# Patient Record
Sex: Male | Born: 1965 | Race: White | Hispanic: No | Marital: Single | Smoking: Former smoker
Health system: Southern US, Community
[De-identification: ages and names within clinical notes are randomized; demographics above are authoritative.]

## PROBLEM LIST (undated history)

## (undated) HISTORY — PX: HERNIA REPAIR: SHX51

---

## 2015-12-12 ENCOUNTER — Ambulatory Visit (HOSPITAL_COMMUNITY)
Admission: RE | Admit: 2015-12-12 | Discharge: 2015-12-12 | Disposition: A | Discharge status: Home / Self Care | Payer: BLUE CROSS/BLUE SHIELD | Source: Ambulatory Visit | Attending: Internal Medicine | Admitting: Internal Medicine

## 2015-12-12 ENCOUNTER — Other Ambulatory Visit (HOSPITAL_COMMUNITY): Payer: Self-pay | Admitting: Internal Medicine

## 2015-12-12 DIAGNOSIS — R059 Cough, unspecified: Secondary | ICD-10-CM

## 2015-12-12 DIAGNOSIS — R918 Other nonspecific abnormal finding of lung field: Secondary | ICD-10-CM | POA: Insufficient documentation

## 2015-12-12 DIAGNOSIS — R05 Cough: Secondary | ICD-10-CM | POA: Diagnosis not present

## 2015-12-26 ENCOUNTER — Other Ambulatory Visit (HOSPITAL_COMMUNITY): Payer: Self-pay | Admitting: Internal Medicine

## 2015-12-26 DIAGNOSIS — E559 Vitamin D deficiency, unspecified: Secondary | ICD-10-CM | POA: Diagnosis not present

## 2015-12-26 DIAGNOSIS — R9389 Abnormal findings on diagnostic imaging of other specified body structures: Secondary | ICD-10-CM

## 2015-12-26 DIAGNOSIS — E291 Testicular hypofunction: Secondary | ICD-10-CM | POA: Diagnosis not present

## 2015-12-26 DIAGNOSIS — R938 Abnormal findings on diagnostic imaging of other specified body structures: Secondary | ICD-10-CM | POA: Diagnosis not present

## 2016-01-03 ENCOUNTER — Ambulatory Visit (HOSPITAL_COMMUNITY)
Admission: RE | Admit: 2016-01-03 | Discharge: 2016-01-03 | Disposition: A | Discharge status: Home / Self Care | Payer: BLUE CROSS/BLUE SHIELD | Source: Ambulatory Visit | Attending: Internal Medicine | Admitting: Internal Medicine

## 2016-01-03 DIAGNOSIS — R0789 Other chest pain: Secondary | ICD-10-CM | POA: Insufficient documentation

## 2016-01-03 DIAGNOSIS — J984 Other disorders of lung: Secondary | ICD-10-CM | POA: Diagnosis not present

## 2016-01-03 DIAGNOSIS — R938 Abnormal findings on diagnostic imaging of other specified body structures: Secondary | ICD-10-CM | POA: Insufficient documentation

## 2016-01-03 DIAGNOSIS — R9389 Abnormal findings on diagnostic imaging of other specified body structures: Secondary | ICD-10-CM

## 2016-01-03 MED ORDER — IOPAMIDOL (ISOVUE-300) INJECTION 61%
75.0000 mL | Freq: Once | INTRAVENOUS | Status: AC | PRN
  Administered 2016-01-03: 75 mL via INTRAVENOUS

## 2016-02-12 DIAGNOSIS — E559 Vitamin D deficiency, unspecified: Secondary | ICD-10-CM | POA: Diagnosis not present

## 2016-02-12 DIAGNOSIS — Z7189 Other specified counseling: Secondary | ICD-10-CM | POA: Diagnosis not present

## 2016-02-12 DIAGNOSIS — E291 Testicular hypofunction: Secondary | ICD-10-CM | POA: Diagnosis not present

## 2016-02-12 DIAGNOSIS — R938 Abnormal findings on diagnostic imaging of other specified body structures: Secondary | ICD-10-CM | POA: Diagnosis not present

## 2016-07-07 DIAGNOSIS — E559 Vitamin D deficiency, unspecified: Secondary | ICD-10-CM | POA: Diagnosis not present

## 2016-11-04 DIAGNOSIS — H9311 Tinnitus, right ear: Secondary | ICD-10-CM | POA: Diagnosis not present

## 2016-11-04 DIAGNOSIS — M25569 Pain in unspecified knee: Secondary | ICD-10-CM | POA: Diagnosis not present

## 2017-01-19 DIAGNOSIS — R5383 Other fatigue: Secondary | ICD-10-CM | POA: Diagnosis not present

## 2017-01-19 DIAGNOSIS — Z0000 Encounter for general adult medical examination without abnormal findings: Secondary | ICD-10-CM | POA: Diagnosis not present

## 2017-01-19 DIAGNOSIS — Z125 Encounter for screening for malignant neoplasm of prostate: Secondary | ICD-10-CM | POA: Diagnosis not present

## 2017-01-19 DIAGNOSIS — E559 Vitamin D deficiency, unspecified: Secondary | ICD-10-CM | POA: Diagnosis not present

## 2017-01-19 DIAGNOSIS — F1010 Alcohol abuse, uncomplicated: Secondary | ICD-10-CM | POA: Diagnosis not present

## 2017-01-19 DIAGNOSIS — E291 Testicular hypofunction: Secondary | ICD-10-CM | POA: Diagnosis not present

## 2017-01-19 DIAGNOSIS — Z0389 Encounter for observation for other suspected diseases and conditions ruled out: Secondary | ICD-10-CM | POA: Diagnosis not present

## 2017-01-26 DIAGNOSIS — E291 Testicular hypofunction: Secondary | ICD-10-CM | POA: Diagnosis not present

## 2017-02-09 DIAGNOSIS — E291 Testicular hypofunction: Secondary | ICD-10-CM | POA: Diagnosis not present

## 2017-02-19 ENCOUNTER — Encounter: Payer: Self-pay | Admitting: Internal Medicine

## 2017-03-23 DIAGNOSIS — E291 Testicular hypofunction: Secondary | ICD-10-CM | POA: Diagnosis not present

## 2017-03-23 DIAGNOSIS — H9311 Tinnitus, right ear: Secondary | ICD-10-CM | POA: Diagnosis not present

## 2017-03-23 DIAGNOSIS — H9209 Otalgia, unspecified ear: Secondary | ICD-10-CM | POA: Diagnosis not present

## 2017-04-14 DIAGNOSIS — E291 Testicular hypofunction: Secondary | ICD-10-CM | POA: Diagnosis not present

## 2017-04-16 ENCOUNTER — Ambulatory Visit: Payer: BLUE CROSS/BLUE SHIELD | Admitting: Gastroenterology

## 2017-07-14 DIAGNOSIS — E291 Testicular hypofunction: Secondary | ICD-10-CM | POA: Diagnosis not present

## 2017-07-14 DIAGNOSIS — K439 Ventral hernia without obstruction or gangrene: Secondary | ICD-10-CM | POA: Diagnosis not present

## 2017-07-14 DIAGNOSIS — R6882 Decreased libido: Secondary | ICD-10-CM | POA: Diagnosis not present

## 2017-07-14 DIAGNOSIS — R5383 Other fatigue: Secondary | ICD-10-CM | POA: Diagnosis not present

## 2017-07-14 DIAGNOSIS — Z125 Encounter for screening for malignant neoplasm of prostate: Secondary | ICD-10-CM | POA: Diagnosis not present

## 2017-08-12 ENCOUNTER — Ambulatory Visit: Payer: BLUE CROSS/BLUE SHIELD | Admitting: General Surgery

## 2017-11-04 ENCOUNTER — Encounter (INDEPENDENT_AMBULATORY_CARE_PROVIDER_SITE_OTHER): Payer: Self-pay | Admitting: *Deleted

## 2017-12-06 ENCOUNTER — Encounter (INDEPENDENT_AMBULATORY_CARE_PROVIDER_SITE_OTHER): Payer: Self-pay | Admitting: *Deleted

## 2018-01-25 DIAGNOSIS — R6882 Decreased libido: Secondary | ICD-10-CM | POA: Diagnosis not present

## 2018-01-25 DIAGNOSIS — Z125 Encounter for screening for malignant neoplasm of prostate: Secondary | ICD-10-CM | POA: Diagnosis not present

## 2018-01-25 DIAGNOSIS — R5383 Other fatigue: Secondary | ICD-10-CM | POA: Diagnosis not present

## 2018-01-25 DIAGNOSIS — Z13220 Encounter for screening for lipoid disorders: Secondary | ICD-10-CM | POA: Diagnosis not present

## 2018-01-25 DIAGNOSIS — E559 Vitamin D deficiency, unspecified: Secondary | ICD-10-CM | POA: Diagnosis not present

## 2018-01-25 DIAGNOSIS — Z0000 Encounter for general adult medical examination without abnormal findings: Secondary | ICD-10-CM | POA: Diagnosis not present

## 2018-07-28 ENCOUNTER — Ambulatory Visit (INDEPENDENT_AMBULATORY_CARE_PROVIDER_SITE_OTHER): Payer: Self-pay | Admitting: Internal Medicine

## 2018-07-28 DIAGNOSIS — E559 Vitamin D deficiency, unspecified: Secondary | ICD-10-CM | POA: Diagnosis not present

## 2018-07-28 DIAGNOSIS — R030 Elevated blood-pressure reading, without diagnosis of hypertension: Secondary | ICD-10-CM | POA: Diagnosis not present

## 2018-07-28 DIAGNOSIS — R6882 Decreased libido: Secondary | ICD-10-CM | POA: Diagnosis not present

## 2018-07-28 DIAGNOSIS — R5383 Other fatigue: Secondary | ICD-10-CM | POA: Diagnosis not present

## 2018-09-18 IMAGING — DX DG CHEST 2V
2 series · 2 of 2 positions shown · non-contrast
Comparison: No recent prior.

CLINICAL DATA: Chronic cough.

EXAM:
CHEST  2 VIEW

[chest pa]
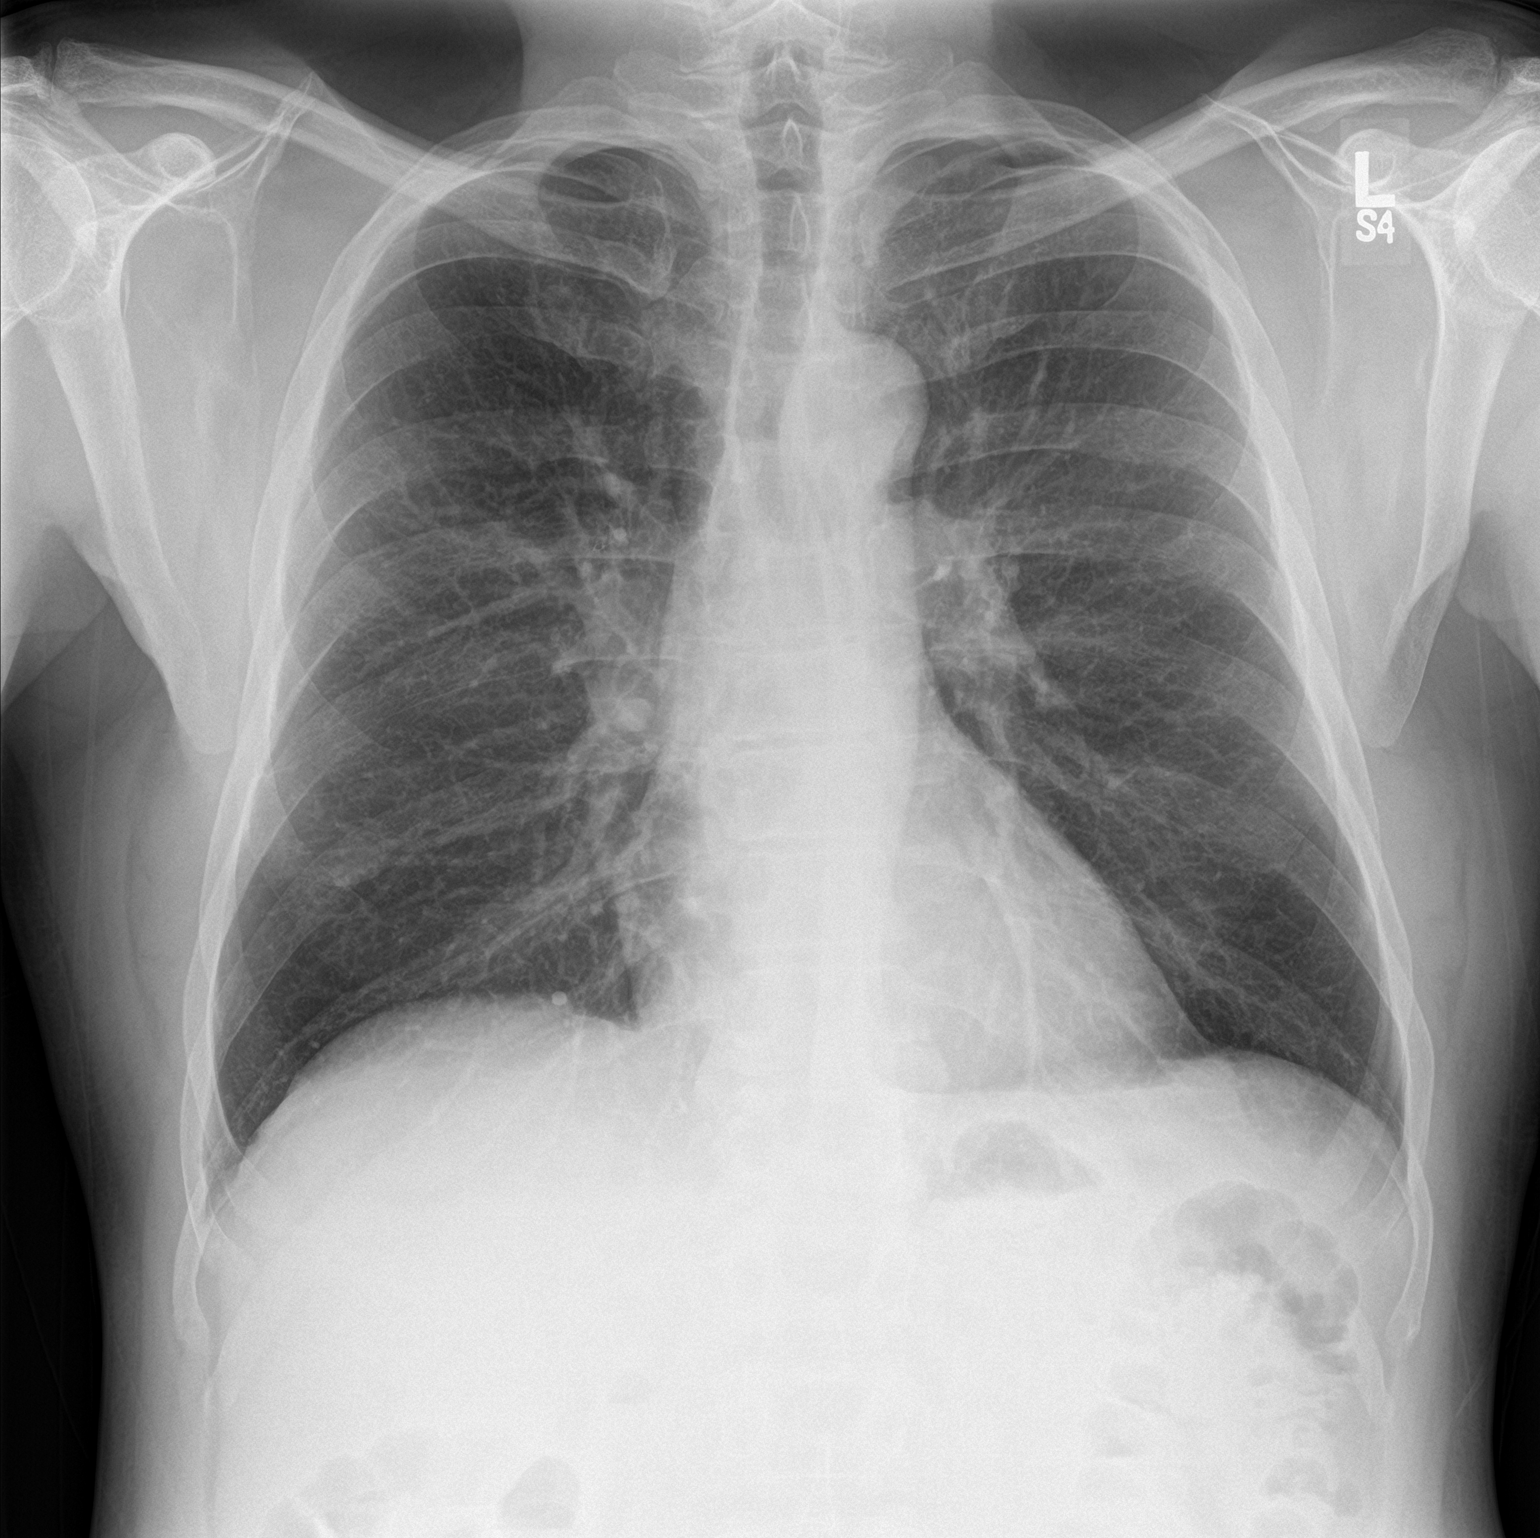

[chest lat]
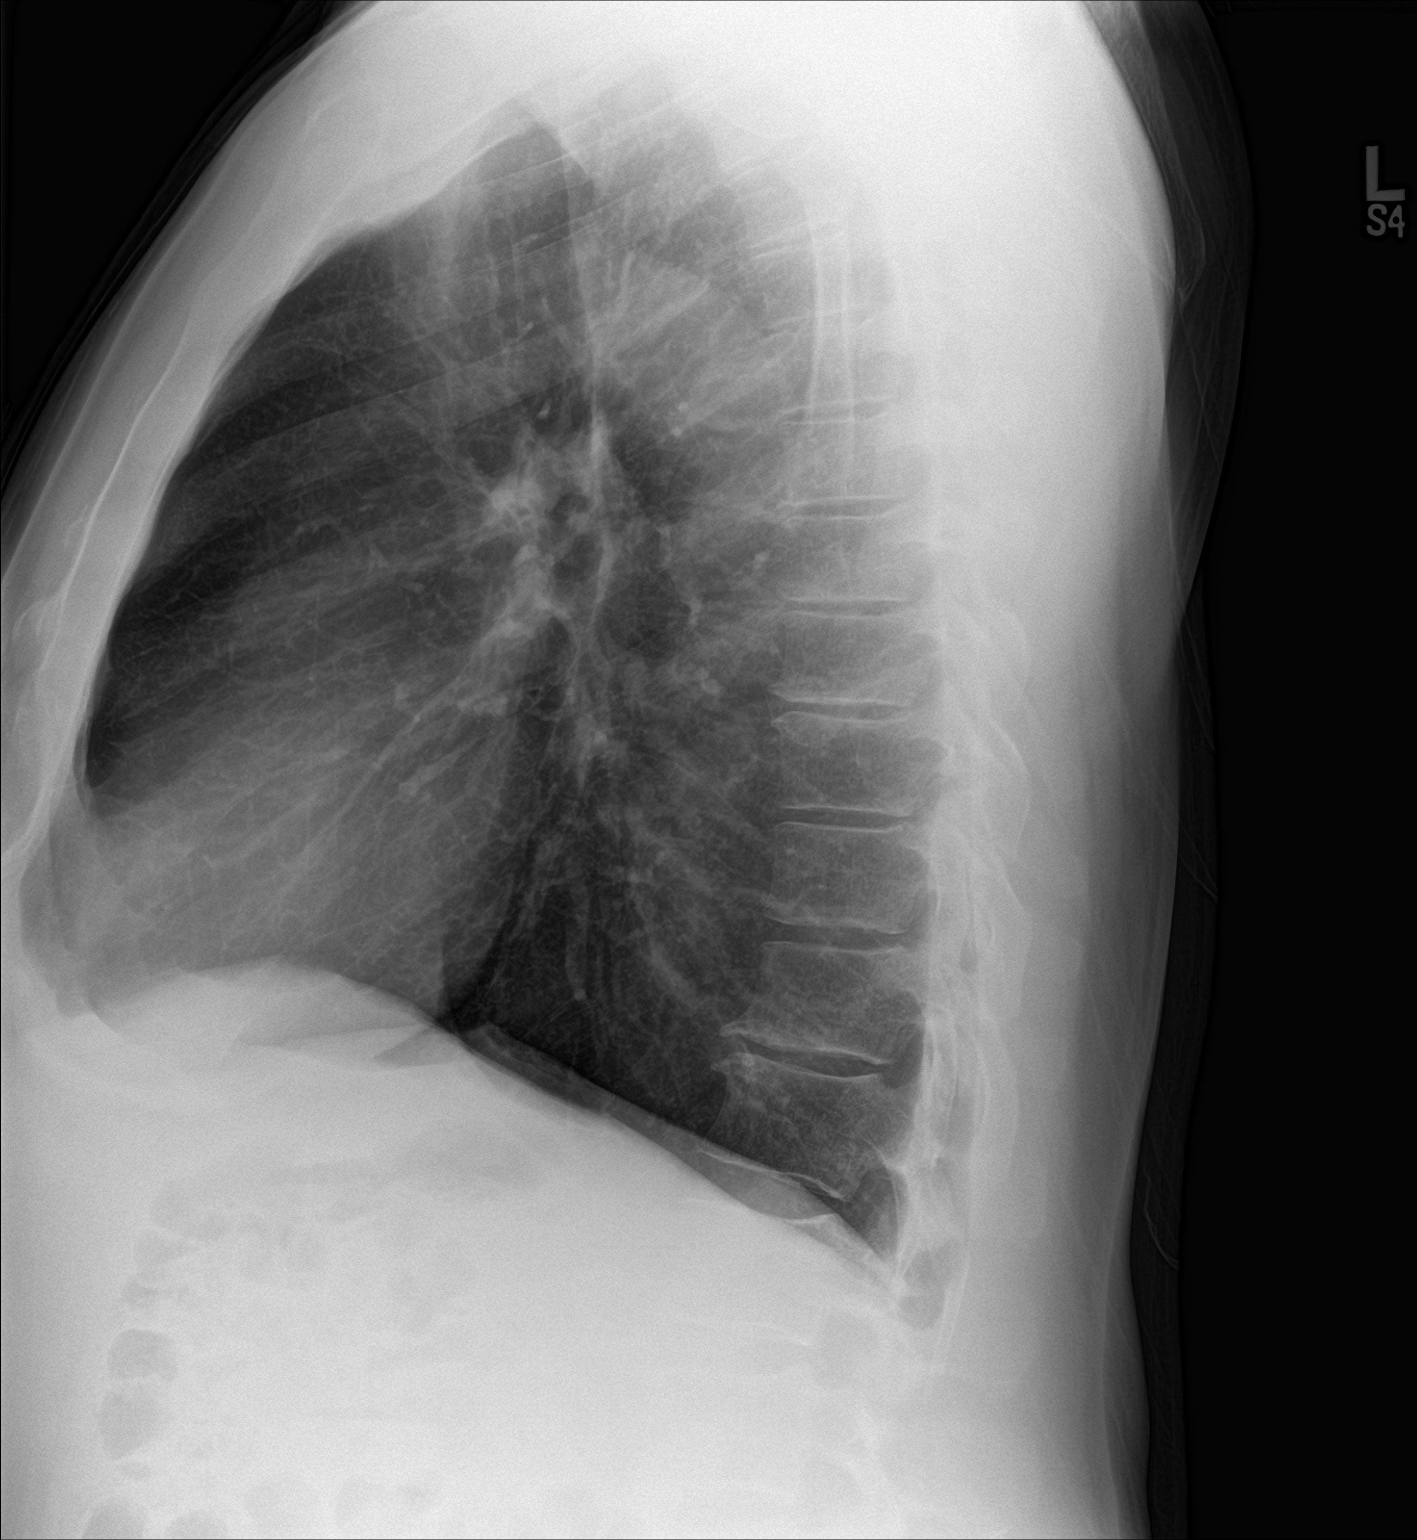

[2 of 2 positions shown; findings below may reference images not displayed]

FINDINGS: Mediastinum and hilar structures normal. Heart size normal. Mild
right middle lobe atelectasis and or infiltrate. No pleural effusion
or pneumothorax.
IMPRESSION: Mild right middle lobe atelectasis and or infiltrate .

## 2019-01-27 ENCOUNTER — Telehealth (INDEPENDENT_AMBULATORY_CARE_PROVIDER_SITE_OTHER): Payer: Self-pay

## 2019-01-31 ENCOUNTER — Encounter (INDEPENDENT_AMBULATORY_CARE_PROVIDER_SITE_OTHER): Payer: Self-pay | Admitting: Internal Medicine

## 2019-01-31 ENCOUNTER — Ambulatory Visit (INDEPENDENT_AMBULATORY_CARE_PROVIDER_SITE_OTHER): Payer: BC Managed Care – PPO | Admitting: Internal Medicine

## 2019-01-31 ENCOUNTER — Other Ambulatory Visit: Payer: Self-pay

## 2019-01-31 DIAGNOSIS — E559 Vitamin D deficiency, unspecified: Secondary | ICD-10-CM | POA: Diagnosis not present

## 2019-01-31 DIAGNOSIS — E291 Testicular hypofunction: Secondary | ICD-10-CM

## 2019-01-31 HISTORY — DX: Testicular hypofunction: E29.1

## 2019-01-31 HISTORY — DX: Vitamin D deficiency, unspecified: E55.9

## 2019-01-31 NOTE — Progress Notes (Signed)
Metrics: Intervention Frequency ACO  Documented Smoking Status Yearly  Screened one or more times in 24 months  Cessation Counseling or  Active cessation medication Past 24 months  Past 24 months   Guideline developer: UpToDate (See UpToDate for funding source) Date Released: 2014       Wellness Office Visit  Subjective:  Patient ID: Hunter Sosa, male    DOB: 05-Dec-1965  Age: 53 y.o. MRN: [id number]  CC: This man comes in for follow-up of hypogonadism and vitamin D deficiency. HPI He is doing well.  He continues on testosterone therapy once a week without any problems. He also did increase the vitamin D3 to 10,000 units daily from the last visit. 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 No pertinent family history.  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   Social History   Tobacco Use   Smoking status: Former Smoker   Smokeless tobacco: Never Used  Substance Use Topics   Alcohol use: Not on file    Current Meds  Medication Sig   Cholecalciferol (VITAMIN D-3) 125 MCG (5000 UT) TABS Take 2 tablets by mouth daily.   Testosterone Cypionate 200 MG/ML SOLN Inject 100 mg as directed once a week.      Bio Identical Hormones  Testosterone therapy is being used off label for symptoms of testosterone deficiency and benefits that it produces based on several studies.  These benefits include decreasing body fat, increasing in lean muscle mass and increasing in bone density.  There is improvement of memory, cognition.  There is improvement in exercise tolerance and endurance.  Testosterone therapy has also been shown to be protective against coronary artery disease, cerebrovascular disease, diabetes, hypertension and  degenerative joint disease. I have discussed with the patient the FDA warnings regarding testosterone therapy, benefits and side effects and modes of administration as well as monitoring blood levels and side effects  on a regular basis The patient is agreeable that testosterone therapy should be an integral part of his/her wellness,quality of life and prevention of chronic disease.  Objective:   Today's Vitals: BP (!) 150/90 (BP Location: Right Arm, Patient Position: Sitting, Cuff Size: Normal)    Pulse 71    Temp 98.8 F (37.1 C) (Temporal)    Resp 18    Ht 5' 11"  (1.803 m)    Wt 221 lb (100.2 kg)    SpO2 96% Comment: with mask.   BMI 30.82 kg/m  Vitals with BMI 01/31/2019  Height 5' 11"   Weight 221 lbs  BMI 03.01  Systolic 314  Diastolic 90  Pulse 71     Physical Exam  He looks systemically well.  Systolic and diastolic blood pressure somewhat elevated today and we will need to keep a close check on this.  He will do this at home.  He is alert and orientated without any focal neurological signs.    Assessment   1. Testicular failure   2. Vitamin D deficiency disease       Tests ordered Orders Placed This Encounter  Procedures   CMP with eGFR([facility])   Vitamin D, 25-hydroxy     Plan: 1. Blood work is ordered as above. 2. He is not keen to be on high blood pressure medicine if  he requires this so he will try to work on diet and we discussed intermittent fasting today as a way to also help his immune system. 3. Further recommendations will depend on blood results and I will see him in 6 months time for an annual physical exam.  I spent 25 to 30 minutes with patient face-to-face, more than 50% of the time was involved in discussing immunity and intermittent fasting.   No orders of the defined types were placed in this encounter.   Doree Albee, MD

## 2019-01-31 NOTE — Telephone Encounter (Signed)
Faxed to [place] [facility]

## 2019-01-31 NOTE — Patient Instructions (Signed)
[facility] Dietary Recommendations for Weight Loss What to Avoid . Avoid added sugars o Often added sugar can be found in processed foods such as many condiments, dry cereals, cakes, cookies, chips, crisps, crackers, candies, sweetened drinks, etc.  o Read labels and AVOID/DECREASE use of foods with the following in their ingredient list: Sugar, fructose, high fructose corn syrup, sucrose, glucose, maltose, dextrose, molasses, cane sugar, brown sugar, any type of syrup, agave nectar, etc.   . Avoid snacking in between meals . Avoid foods made with flour o If you are going to eat food made with flour, choose those made with whole-grains; and, minimize your consumption as much as is tolerable . Avoid processed foods o These foods are generally stocked in the middle of the grocery store. Focus on shopping on the perimeter of the grocery.  What to Include . Vegetables o GREEN LEAFY VEGETABLES: Kale, spinach, mustard greens, collard greens, cabbage, broccoli, etc. o OTHER: Asparagus, cauliflower, eggplant, carrots, peas, Brussel sprouts, tomatoes, bell peppers, zucchini, beets, cucumbers, etc. . Grains, seeds, and legumes o Beans: kidney beans, black eyed peas, garbanzo beans, black beans, pinto beans, etc. o Whole, unrefined grains: brown rice, barley, bulgur, oatmeal, etc. . Healthy fats  o Avoid highly processed fats such as vegetable oil o Examples of healthy fats: avocado, olives, virgin olive oil, dark chocolate (?72% Cocoa), nuts (peanuts, almonds, walnuts, cashews, pecans, etc.) . Low - Moderate Intake of Animal Sources of Protein o Meat sources: chicken, turkey, salmon, tuna. Limit to 4 ounces of meat at one time. o Consider limiting dairy sources, but when choosing dairy focus on: PLAIN Greek yogurt, cottage cheese, high-protein milk . Fruit o Choose berries  When to Eat . Intermittent Fasting: o Choosing not to eat for a specific time period, but DO FOCUS ON HYDRATION  when fasting o Multiple Techniques: - Time Restricted Eating: eat 3 meals in a day, each meal lasting no more than 60 minutes, no snacks between meals - 16-18 hour fast: fast for 16 to 18 hours up to 7 days a week. Often suggested to start with 2-3 nonconsecutive days per week.  . Remember the time you sleep is counted as fasting.  . Examples of eating schedule: Fast from 7:00pm-11:00am. Eat between 11:00am-7:00pm.  - 24-hour fast: fast for 24 hours up to every other day. Often suggested to start with 1 day per week . Remember the time you sleep is counted as fasting . Examples of eating schedule:  o Eating day: eat 2-3 meals on your eating day. If doing 2 meals, each meal should last no more than 90 minutes. If doing 3 meals, each meal should last no more than 60 minutes. Finish last meal by 7:00pm. o Fasting day: Fast until 7:00pm.  o IF YOU FEEL UNWELL FOR ANY REASON/IN ANY WAY WHEN FASTING, STOP FASTING BY EATING A NUTRITIOUS SNACK OR LIGHT MEAL o ALWAYS FOCUS ON HYDRATION DURING FASTS - Acceptable Hydration sources: water, broths, tea/coffee (black tea/coffee is best but using a small amount of whole-fat dairy products in coffee/tea is acceptable).  - Poor Hydration Sources: anything with sugar or artificial sweeteners added to it  These recommendations have been developed for patients that are actively receiving medical care from either Dr. Markeith Sosa or Hunter Gray, DNP, NP-C at [facility]. These recommendations are developed for patients with specific medical conditions and are not meant to be distributed or used by others that are not actively receiving care from either provider listed   above at [facility]. It is not appropriate to participate in the above eating plans without proper medical supervision.   Reference: Fung, J. The obesity code. [place]: [place]; 2016.   

## 2019-02-01 ENCOUNTER — Encounter (INDEPENDENT_AMBULATORY_CARE_PROVIDER_SITE_OTHER): Payer: Self-pay | Admitting: Internal Medicine

## 2019-02-01 LAB — VITAMIN D 25 HYDROXY (VIT D DEFICIENCY, FRACTURES): Vit D, 25-Hydroxy: 47 ng/mL (ref 30–100)

## 2019-02-01 LAB — COMPLETE METABOLIC PANEL WITH GFR
AG Ratio: 2 (calc) (ref 1.0–2.5)
ALT: 24 U/L (ref 9–46)
AST: 28 U/L (ref 10–35)
Albumin: 4.3 g/dL (ref 3.6–5.1)
Alkaline phosphatase (APISO): 50 U/L (ref 35–144)
BUN: 13 mg/dL (ref 7–25)
CO2: 26 mmol/L (ref 20–32)
Calcium: 9.5 mg/dL (ref 8.6–10.3)
Chloride: 102 mmol/L (ref 98–110)
Creat: 1.17 mg/dL (ref 0.70–1.33)
GFR, Est African American: 82 mL/min/{1.73_m2} (ref >=60)
GFR, Est Non African American: 71 mL/min/{1.73_m2} (ref >=60)
Globulin: 2.1 g/dL (calc) (ref 1.9–3.7)
Glucose, Bld: 94 mg/dL (ref 65–99)
Potassium: 4.2 mmol/L (ref 3.5–5.3)
Sodium: 138 mmol/L (ref 135–146)
Total Bilirubin: 0.5 mg/dL (ref 0.2–1.2)
Total Protein: 6.4 g/dL (ref 6.1–8.1)

## 2019-02-23 ENCOUNTER — Other Ambulatory Visit (INDEPENDENT_AMBULATORY_CARE_PROVIDER_SITE_OTHER): Payer: Self-pay

## 2019-02-24 MED ORDER — TESTOSTERONE CYPIONATE 200 MG/ML IJ SOLN: 100.0000 mg | INTRAMUSCULAR | 1 refills | Status: DC

## 2019-03-03 ENCOUNTER — Ambulatory Visit: Payer: BC Managed Care – PPO | Attending: Internal Medicine

## 2019-03-03 DIAGNOSIS — Z20822 Contact with and (suspected) exposure to covid-19: Secondary | ICD-10-CM

## 2019-03-03 DIAGNOSIS — Z20828 Contact with and (suspected) exposure to other viral communicable diseases: Secondary | ICD-10-CM | POA: Diagnosis not present

## 2019-03-04 LAB — NOVEL CORONAVIRUS, NAA: SARS-CoV-2, NAA: DETECTED — AB

## 2019-03-07 ENCOUNTER — Other Ambulatory Visit (INDEPENDENT_AMBULATORY_CARE_PROVIDER_SITE_OTHER): Payer: Self-pay | Admitting: Internal Medicine

## 2019-03-07 ENCOUNTER — Telehealth (INDEPENDENT_AMBULATORY_CARE_PROVIDER_SITE_OTHER): Payer: Self-pay

## 2019-03-07 DIAGNOSIS — U071 COVID-19: Secondary | ICD-10-CM

## 2019-03-07 NOTE — Telephone Encounter (Signed)
Faxed work note to [facility] to Ph:[phone] & [phone]

## 2019-03-07 NOTE — Telephone Encounter (Signed)
Pt stated

## 2019-07-31 ENCOUNTER — Other Ambulatory Visit: Payer: Self-pay

## 2019-07-31 ENCOUNTER — Ambulatory Visit (INDEPENDENT_AMBULATORY_CARE_PROVIDER_SITE_OTHER): Payer: BC Managed Care – PPO | Admitting: Internal Medicine

## 2019-07-31 ENCOUNTER — Encounter (INDEPENDENT_AMBULATORY_CARE_PROVIDER_SITE_OTHER): Payer: Self-pay | Admitting: Internal Medicine

## 2019-07-31 VITALS — BP 145/110 | HR 69 | Temp 97.9°F | Ht 71.0 in | Wt 221.4 lb

## 2019-07-31 DIAGNOSIS — E291 Testicular hypofunction: Secondary | ICD-10-CM | POA: Diagnosis not present

## 2019-07-31 DIAGNOSIS — Z0000 Encounter for general adult medical examination without abnormal findings: Secondary | ICD-10-CM

## 2019-07-31 DIAGNOSIS — E559 Vitamin D deficiency, unspecified: Secondary | ICD-10-CM

## 2019-07-31 DIAGNOSIS — Z125 Encounter for screening for malignant neoplasm of prostate: Secondary | ICD-10-CM | POA: Diagnosis not present

## 2019-07-31 LAB — PSA: PSA: 0.7 ng/mL (ref <=4.0)

## 2019-07-31 NOTE — Progress Notes (Signed)
Chief Complaint: This 54 year old man comes in for an annual physical exam. HPI: He has a history of hypogonadism and is on testosterone therapy once a week and is tolerating this.  He has no problems with this and seems to be beneficial for his symptoms. He also has vitamin D deficiency and is taking vitamin D3 supplementation on a regular basis.  His blood levels on the previous visit were reasonable. He now wishes to be referred for colonoscopy.  Past Medical History:  Diagnosis Date  . 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 Vitamin D deficiency disease 01/31/2019   History reviewed. No pertinent surgical history.   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    Social History   Tobacco Use  . Smoking status: Former Research scientist (life sciences)  . Smokeless tobacco: Never Used  Substance Use Topics  . Alcohol use: Not on file      Allergies: Not on File   Current Meds  Medication Sig  . Cholecalciferol (VITAMIN D-3) 125 MCG (5000 UT) TABS Take 2 tablets by mouth daily.  Marland Kitchen testosterone cypionate (DEPOTESTOSTERONE CYPIONATE) 200 MG/ML injection SMARTSIG:0.5 Milliliter(s) IM Once a Week  . Testosterone Cypionate 200 MG/ML SOLN Inject 100 mg as directed once a week.      Bio-identical hormones Testosterone therapy is being used off label for symptoms of testosterone deficiency and benefits that it produces based on several studies.  These benefits include decreasing body fat, increasing in lean muscle mass and increasing in bone density.  There is improvement of memory, cognition.  There is improvement in exercise tolerance and endurance.  Testosterone therapy has also been shown to be protective against coronary artery disease, cerebrovascular disease, diabetes, hypertension and degenerative joint disease. I have  discussed with the patient the FDA warnings regarding testosterone therapy, benefits and side effects and modes of administration as well as monitoring blood levels and side effects  on a regular basis The patient is agreeable that testosterone therapy should be an integral part of his/her wellness,quality of life and prevention of chronic disease.  [id number] from the symptoms mentioned above,there are no other symptoms referable to all systems reviewed.  Physical Exam: Blood pressure (!) 145/110, pulse 69, temperature 97.9 F (36.6 C), temperature source Temporal, height 5\' 11"  (1.803 m), weight 221 lb 6.4 oz (100.4 kg), SpO2 96 %. Vitals with BMI 07/31/2019 01/31/2019  Height 5\' 11"  5\' 11"   Weight 221 lbs 6 oz 221 lbs  BMI Q000111Q [id number]  Systolic Q000111Q Q000111Q  Diastolic A999333 90  Pulse 69 71      He looks systemically well.  He remains obese and he has not really lost any further weight since the last time I saw him nor has he gained any weight. General: Alert, cooperative, and appears to be the stated age.No pallor.  No jaundice.  No clubbing. Head: Normocephalic Eyes: Sclera white, pupils equal and reactive to light, red reflex x 2,  Ears: Normal bilaterally Oral cavity: Lips, mucosa, and tongue normal: Teeth and gums normal Neck: No adenopathy, supple, symmetrical, trachea midline, and thyroid does not appear enlarged Respiratory: Clear to auscultation bilaterally.No wheezing, crackles or bronchial breathing. Cardiovascular: Heart sounds are present and appear to be normal without murmurs or added sounds.  No carotid bruits.  Peripheral pulses are present and equal bilaterally.: Gastrointestinal:positive bowel sounds, no hepatosplenomegaly.  No masses felt.No tenderness. Skin: Clear, No rashes noted.No worrisome skin lesions seen. Neurological: Grossly intact without focal findings, cranial nerves II through XII intact, muscle strength equal bilaterally Musculoskeletal: No acute joint  abnormalities noted.Full range of movement noted with joints. Psychiatric: Affect appropriate, non-anxious.    Assessment  1. Testicular failure   2. Vitamin D deficiency disease   3. Routine general medical examination at a health care facility   4. Special screening for malignant neoplasm of prostate     Tests Ordered:   Orders Placed This Encounter  Procedures  . PSA     Plan  1. We will just check his PSA today as previous other blood work was within normal limits. 2. He will continue with testosterone therapy. 3. We will refer him to colonoscopy once he lets [place] know which gastroenterologist or surgeon he prefers in [place]. 4. Follow-up in 6 months.     No orders of the defined types were placed in this encounter.    Pollyann Roa C Eufelia Veno   07/31/2019, 4:36 PM

## 2019-08-08 ENCOUNTER — Telehealth (INDEPENDENT_AMBULATORY_CARE_PROVIDER_SITE_OTHER): Payer: Self-pay

## 2019-08-09 ENCOUNTER — Other Ambulatory Visit (INDEPENDENT_AMBULATORY_CARE_PROVIDER_SITE_OTHER): Payer: Self-pay

## 2019-08-09 ENCOUNTER — Telehealth (INDEPENDENT_AMBULATORY_CARE_PROVIDER_SITE_OTHER): Payer: Self-pay

## 2019-08-09 ENCOUNTER — Other Ambulatory Visit (INDEPENDENT_AMBULATORY_CARE_PROVIDER_SITE_OTHER): Payer: Self-pay | Admitting: Internal Medicine

## 2019-08-09 DIAGNOSIS — Z1211 Encounter for screening for malignant neoplasm of colon: Secondary | ICD-10-CM

## 2019-08-09 MED ORDER — ALBUTEROL SULFATE HFA 108 (90 BASE) MCG/ACT IN AERS: 2.0000 | INHALATION_SPRAY | Freq: Four times a day (QID) | RESPIRATORY_TRACT | 3 refills | Status: DC | PRN

## 2019-08-09 MED ORDER — TESTOSTERONE CYPIONATE 200 MG/ML IJ SOLN: 100.0000 mg | INTRAMUSCULAR | 1 refills | Status: DC

## 2019-08-09 NOTE — Telephone Encounter (Signed)
Please move his Rx for next refills to [facility] or  pharmacy he can get testosterone in 10Ml bottle without a circus and petting zoo show stated by mr Ellwood.

## 2019-08-09 NOTE — Telephone Encounter (Signed)
Let me know which pharmacy the patient would like to send me the testosterone prescription to.

## 2019-08-09 NOTE — Progress Notes (Signed)
Signed. Thanks.

## 2019-08-09 NOTE — Telephone Encounter (Signed)
I have sent testosterone refill and albuterol inhaler to [facility]. Okay to refer to the surgeon in [place].  I think his name is Dr. Ladona Horns.

## 2019-08-09 NOTE — Telephone Encounter (Signed)
Pt called and notified of refills. Putting in the referral for Dr Ladona Horns now for colonoscopy.

## 2019-08-09 NOTE — Telephone Encounter (Signed)
Pt is upset with [facility]. We may need to send RX's to different pharmacy. Call pt back to see what he would like to do

## 2019-08-17 ENCOUNTER — Other Ambulatory Visit (INDEPENDENT_AMBULATORY_CARE_PROVIDER_SITE_OTHER): Payer: Self-pay

## 2019-08-17 ENCOUNTER — Encounter (INDEPENDENT_AMBULATORY_CARE_PROVIDER_SITE_OTHER): Payer: Self-pay

## 2019-08-17 MED ORDER — TESTOSTERONE CYPIONATE 200 MG/ML IJ SOLN: 100.0000 mg | INTRAMUSCULAR | 1 refills | Status: DC

## 2019-08-29 ENCOUNTER — Other Ambulatory Visit (INDEPENDENT_AMBULATORY_CARE_PROVIDER_SITE_OTHER): Payer: Self-pay

## 2019-08-29 MED ORDER — TESTOSTERONE CYPIONATE 200 MG/ML IJ SOLN: 100.0000 mg | INTRAMUSCULAR | 1 refills | Status: DC

## 2019-08-29 NOTE — Telephone Encounter (Signed)
Rx went to [place] is asking to resend Rx to [facility], please advise?

## 2019-10-12 ENCOUNTER — Encounter (INDEPENDENT_AMBULATORY_CARE_PROVIDER_SITE_OTHER): Payer: Self-pay | Admitting: *Deleted

## 2019-12-25 ENCOUNTER — Other Ambulatory Visit (INDEPENDENT_AMBULATORY_CARE_PROVIDER_SITE_OTHER): Payer: Self-pay | Admitting: Internal Medicine

## 2019-12-25 ENCOUNTER — Telehealth (INDEPENDENT_AMBULATORY_CARE_PROVIDER_SITE_OTHER): Payer: Self-pay

## 2019-12-25 MED ORDER — TESTOSTERONE CYPIONATE 200 MG/ML IJ SOLN: 100.0000 mg | INTRAMUSCULAR | 1 refills | Status: DC

## 2019-12-25 MED ORDER — ALBUTEROL SULFATE HFA 108 (90 BASE) MCG/ACT IN AERS: 2.0000 | INHALATION_SPRAY | Freq: Four times a day (QID) | RESPIRATORY_TRACT | 3 refills | Status: DC | PRN

## 2019-12-25 NOTE — Telephone Encounter (Signed)
Called patient and LMOVM to return call  Left a voice message to let patient know that both meds have been sent to his pharmacy.

## 2019-12-25 NOTE — Telephone Encounter (Signed)
Please let the patient know that I have sent both these medications to [facility].

## 2019-12-25 NOTE — Telephone Encounter (Signed)
Patient called and left a VM that he needs refills on his:  Testosterone 200 mg Last filled 08/29/2019, 10 mL with 1 refill  Albuterol Inhaler Last filled 08/09/2019, 18 g with 3 refills  Last OV 07/31/2019 ROV 01/31/2020

## 2019-12-26 ENCOUNTER — Other Ambulatory Visit (INDEPENDENT_AMBULATORY_CARE_PROVIDER_SITE_OTHER): Payer: Self-pay | Admitting: Internal Medicine

## 2019-12-26 MED ORDER — TESTOSTERONE CYPIONATE 200 MG/ML IJ SOLN: 100.0000 mg | INTRAMUSCULAR | 1 refills | Status: DC

## 2019-12-26 MED ORDER — ALBUTEROL SULFATE HFA 108 (90 BASE) MCG/ACT IN AERS: 2.0000 | INHALATION_SPRAY | Freq: Four times a day (QID) | RESPIRATORY_TRACT | 3 refills | Status: DC | PRN

## 2020-01-01 ENCOUNTER — Other Ambulatory Visit (INDEPENDENT_AMBULATORY_CARE_PROVIDER_SITE_OTHER): Payer: Self-pay | Admitting: *Deleted

## 2020-01-01 ENCOUNTER — Encounter (INDEPENDENT_AMBULATORY_CARE_PROVIDER_SITE_OTHER): Payer: Self-pay | Admitting: *Deleted

## 2020-01-01 ENCOUNTER — Ambulatory Visit (INDEPENDENT_AMBULATORY_CARE_PROVIDER_SITE_OTHER): Payer: BC Managed Care – PPO | Admitting: Gastroenterology

## 2020-01-01 ENCOUNTER — Other Ambulatory Visit: Payer: Self-pay

## 2020-01-01 ENCOUNTER — Telehealth (INDEPENDENT_AMBULATORY_CARE_PROVIDER_SITE_OTHER): Payer: Self-pay | Admitting: *Deleted

## 2020-01-01 ENCOUNTER — Encounter (INDEPENDENT_AMBULATORY_CARE_PROVIDER_SITE_OTHER): Payer: Self-pay | Admitting: Gastroenterology

## 2020-01-01 VITALS — BP 168/101 | HR 56 | Temp 98.0°F | Ht 71.0 in | Wt 226.9 lb

## 2020-01-01 DIAGNOSIS — K219 Gastro-esophageal reflux disease without esophagitis: Secondary | ICD-10-CM

## 2020-01-01 DIAGNOSIS — Z800 Family history of malignant neoplasm of digestive organs: Secondary | ICD-10-CM

## 2020-01-01 DIAGNOSIS — Z7189 Other specified counseling: Secondary | ICD-10-CM | POA: Diagnosis not present

## 2020-01-01 MED ORDER — SUTAB 1479-225-188 MG PO TABS: 1.0000 | ORAL_TABLET | Freq: Once | ORAL | 0 refills | Status: AC

## 2020-01-01 MED ORDER — PANTOPRAZOLE SODIUM 40 MG PO TBEC: 40.0000 mg | DELAYED_RELEASE_TABLET | Freq: Every day | ORAL | 3 refills | Status: DC

## 2020-01-01 NOTE — Telephone Encounter (Signed)
Patient needs Sutab (copay card) ° °

## 2020-01-01 NOTE — Progress Notes (Signed)
Patient profile: Hunter Sosa is a 54 y.o. male seen for evaluation of GERD and colonoscopy.   History of Present Illness: Hunter Sosa is seen today for evaluation of GERD, he reports this is been ongoing since his teen years and he had an endoscopy as well as esophageal manometry about 30 years ago showing hiatal hernia.  He was #250lbs when symptoms were worst and improved as lost some weight. He is still having symptoms several times a week and uses Tums and Gas-X as needed. More recently over the past 2 months he is having issues with pain in his lower esophagus, this most notably occurs after eating lunch and feels that food is lodged for 30 to 45 seconds and cause discomfort.  Discomfort will resolve once food seem to pass to stomach.  He has some dysphagia if he eats too fast as well.  He denies any liquid or pill dysphagia.  Taking Tums 4-5 times a week and Gas-X as needed.  He denies nausea vomiting.  Generally having a bowel movement daily denies any lower abdominal pain, constipation, diarrhea, melena, rectal bleeding.  Non-smoker.  Denies NSAID use.  Does drink 6-9 beers per night, states he mainly drinks to help him sleep better.  History of asthma and uses albuterol inhaler on as needed basis with exertion, particularly walking up hills in farming. Slightly needing more since had COVID in Dec 2020  Wt Readings from Last 3 Encounters:  01/01/20 226 lb 14.4 oz (102.9 kg)  07/31/19 221 lb 6.4 oz (100.4 kg)  01/31/19 221 lb (100.2 kg)     Last Colonoscopy: Per patient 30 years ago Last Endoscopy: Per patient 30 years ago and also had manometry   Past Medical History:  Past Medical History:  Diagnosis Date  . 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 Vitamin D deficiency disease 01/31/2019    Problem List: Patient Active Problem List   Diagnosis Date Noted  . 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 Vitamin D deficiency disease 01/31/2019    Past Surgical History: Past Surgical  History:  Procedure Laterality Date  . HERNIA REPAIR Left    At age 52 , then again in 2004    Allergies: No Known Allergies    Home Medications:  Current Outpatient Medications:  .  albuterol (VENTOLIN HFA) 108 (90 Base) MCG/ACT inhaler, Inhale 2 puffs into the lungs every 6 (six) hours as needed for wheezing or shortness of breath., Disp: 18 g, Rfl: 3 .  Cholecalciferol (VITAMIN D-3) 125 MCG (5000 UT) TABS, Take 2 tablets by mouth daily., Disp: , Rfl:  .  Testosterone Cypionate 200 MG/ML SOLN, Inject 100 mg as directed once a week., Disp: 10 mL, Rfl: 1   Family History: family history includes Colon cancer in his father and mother; Crohn's disease in his father; Hypertension in his father.   Per patient father dx colon cancer in 53s and Crohn's in 40-50s   Social History:   reports that he quit smoking about 20 years ago. He quit smokeless tobacco use about 8 years ago. He reports current alcohol use of about 6.0 standard drinks of alcohol per week. He reports current drug use. Drug: Marijuana.   Review of Systems: Constitutional: Denies weight loss/weight gain  Eyes: No changes in vision. ENT: No oral lesions, sore throat.  GI: see HPI.  Heme/Lymph: No easy bruising.  CV: No chest pain.  GU: No hematuria.  Integumentary: No rashes.  Neuro: No headaches.  Psych: No depression/anxiety.  Endocrine: No heat/cold intolerance.  Allergic/Immunologic: No urticaria.  Resp: No cough, SOB.  Musculoskeletal: No joint swelling.    Physical Examination: BP (!) 185/91 (BP Location: Right Arm, Patient Position: Sitting, Cuff Size: Large)   Pulse (!) 56   Temp 98 F (36.7 C) (Oral)   Ht 5\' 11"  (1.803 m)   Wt 226 lb 14.4 oz (102.9 kg)   BMI 31.65 kg/m    Repeat - 168/101 - patient has BP cuff at home, will monitor and notify pcp if evelated  Gen: NAD, alert and oriented x 4 HEENT: PEERLA, EOMI, Neck: supple, no JVD Chest: CTA bilaterally, no wheezes, crackles, or other  adventitious sounds CV: RRR, no m/g/c/r Abd: soft, NT, ND, +BS in all four quadrants; no HSM, guarding, ridigity, or rebound tenderness Ext: no edema, well perfused with 2+ pulses, Skin: no rash or lesions noted on observed skin Lymph: no noted LAD  Data:   01/2019 labs reviewed    Assessment/Plan: Mr. Rathgeber is a 54 y.o. male  Rosalio was seen today for new patient (initial visit).  Diagnoses and all orders for this visit:  Family history of colon cancer  Gastroesophageal reflux disease, unspecified whether esophagitis present      1.  GERD-with occasional dysphagia, needs upper endoscopy for evaluation.  He is not currently on a PPI and will start Protonix 40 mg once a day.  We discussed diet modifications. Does eat late in evenings - will try to remain upright after meals. Alcohol reduction as below.  2.  Family history of colon cancer-mother and father both in their 20s.  Due for colonoscopy (none prior). No lower GI sx.   3. Alcohol abuse - discussed decreasing daily alcohol intake and long term consequences of heavy frequent alcohol use. Last LFTs in 01/2019 at PCP were normal. Has yearly f/up next month. This may be problematic long term if not decreased   Patient denies CP, SOB, and use of blood thinners. I discussed the risks and benefits of procedure including bleeding, perforation, infection, missed lesions, medication reactions and possible hospitalization or surgery if complications. All questions answered.  Denies prior issues with sedation.  I personally performed the service, non-incident to. (WP)  Laurine Blazer, [facility]

## 2020-01-01 NOTE — Patient Instructions (Addendum)
We are scheduling endoscopy and colonoscopy. Please monitor blood pressure and decrease alcohol intake as discussed

## 2020-01-03 DIAGNOSIS — K219 Gastro-esophageal reflux disease without esophagitis: Secondary | ICD-10-CM | POA: Insufficient documentation

## 2020-01-08 ENCOUNTER — Ambulatory Visit (INDEPENDENT_AMBULATORY_CARE_PROVIDER_SITE_OTHER): Payer: BC Managed Care – PPO | Admitting: Gastroenterology

## 2020-01-11 ENCOUNTER — Telehealth (INDEPENDENT_AMBULATORY_CARE_PROVIDER_SITE_OTHER): Payer: Self-pay

## 2020-01-11 NOTE — Telephone Encounter (Signed)
Please notify patient I would definitely recommend continuing with upper endoscopy if he is having trouble swallowing as she may have a narrowing/stricture.  If his dysphagia has completely resolved he can hold off and we can just do a colonoscopy.

## 2020-01-11 NOTE — Telephone Encounter (Signed)
Hunter Sosa is calling stating that the Pantoprazole 40mg  is helping and he is wanting to hold off on the Upper Endoscopy at this time, Please advise?

## 2020-01-17 ENCOUNTER — Other Ambulatory Visit (HOSPITAL_COMMUNITY)
Admission: RE | Admit: 2020-01-17 | Discharge: 2020-01-17 | Disposition: A | Discharge status: Home / Self Care | Payer: BC Managed Care – PPO | Source: Ambulatory Visit | Attending: Internal Medicine | Admitting: Internal Medicine

## 2020-01-17 ENCOUNTER — Other Ambulatory Visit: Payer: Self-pay

## 2020-01-17 DIAGNOSIS — Z01812 Encounter for preprocedural laboratory examination: Secondary | ICD-10-CM | POA: Insufficient documentation

## 2020-01-17 DIAGNOSIS — Z20822 Contact with and (suspected) exposure to covid-19: Secondary | ICD-10-CM | POA: Insufficient documentation

## 2020-01-17 LAB — SARS CORONAVIRUS 2 (TAT 6-24 HRS): SARS Coronavirus 2: NEGATIVE

## 2020-01-18 ENCOUNTER — Other Ambulatory Visit: Payer: Self-pay

## 2020-01-18 ENCOUNTER — Ambulatory Visit (HOSPITAL_COMMUNITY)
Admission: RE | Admit: 2020-01-18 | Discharge: 2020-01-18 | Disposition: A | Discharge status: Home / Self Care | Payer: BC Managed Care – PPO | Attending: Internal Medicine | Admitting: Internal Medicine

## 2020-01-18 ENCOUNTER — Encounter (HOSPITAL_COMMUNITY): Payer: Self-pay | Admitting: Internal Medicine

## 2020-01-18 ENCOUNTER — Encounter (HOSPITAL_COMMUNITY)
Admission: RE | Disposition: A | Discharge status: Home / Self Care | Payer: Self-pay | Source: Home / Self Care | Attending: Internal Medicine

## 2020-01-18 DIAGNOSIS — D125 Benign neoplasm of sigmoid colon: Secondary | ICD-10-CM | POA: Diagnosis not present

## 2020-01-18 DIAGNOSIS — Z1211 Encounter for screening for malignant neoplasm of colon: Secondary | ICD-10-CM | POA: Diagnosis not present

## 2020-01-18 DIAGNOSIS — K635 Polyp of colon: Secondary | ICD-10-CM | POA: Diagnosis not present

## 2020-01-18 DIAGNOSIS — D127 Benign neoplasm of rectosigmoid junction: Secondary | ICD-10-CM | POA: Diagnosis not present

## 2020-01-18 DIAGNOSIS — K219 Gastro-esophageal reflux disease without esophagitis: Secondary | ICD-10-CM

## 2020-01-18 DIAGNOSIS — Z800 Family history of malignant neoplasm of digestive organs: Secondary | ICD-10-CM | POA: Insufficient documentation

## 2020-01-18 HISTORY — PX: COLONOSCOPY: SHX5424

## 2020-01-18 HISTORY — PX: POLYPECTOMY: SHX5525

## 2020-01-18 LAB — HM COLONOSCOPY

## 2020-01-18 SURGERY — COLONOSCOPY
Anesthesia: Moderate Sedation

## 2020-01-18 MED ORDER — LIDOCAINE VISCOUS HCL 2 % MT SOLN
OROMUCOSAL | Status: AC
  Filled 2020-01-18: qty 15

## 2020-01-18 MED ORDER — STERILE WATER FOR IRRIGATION IR SOLN
Status: DC | PRN
  Administered 2020-01-18: 100 mL

## 2020-01-18 MED ORDER — MEPERIDINE HCL 50 MG/ML IJ SOLN
INTRAMUSCULAR | Status: AC
  Filled 2020-01-18: qty 1

## 2020-01-18 MED ORDER — MIDAZOLAM HCL 5 MG/5ML IJ SOLN
INTRAMUSCULAR | Status: DC | PRN
  Administered 2020-01-18 (×2): 2 mg via INTRAVENOUS
  Administered 2020-01-18: 3 mg via INTRAVENOUS
  Administered 2020-01-18: 2 mg via INTRAVENOUS

## 2020-01-18 MED ORDER — MEPERIDINE HCL 50 MG/ML IJ SOLN
INTRAMUSCULAR | Status: DC | PRN
  Administered 2020-01-18 (×3): 25 mg

## 2020-01-18 MED ORDER — SODIUM CHLORIDE 0.9 % IV SOLN: INTRAVENOUS | Status: DC

## 2020-01-18 MED ORDER — MIDAZOLAM HCL 5 MG/5ML IJ SOLN
INTRAMUSCULAR | Status: AC
  Filled 2020-01-18: qty 10

## 2020-01-18 NOTE — Discharge Instructions (Signed)
No aspirin or NSAIDs for 2 days. Resume usual medications and diet as before. No driving for 24 hours. Physician will call you with biopsy results.   Colon Polyps  Polyps are tissue growths inside the body. Polyps can grow in many places, including the large intestine (colon). A polyp may be a round bump or a mushroom-shaped growth. You could have one polyp or several. Most colon polyps are noncancerous (benign). However, some colon polyps can become cancerous over time. Finding and removing the polyps early can help prevent this. What are the causes? The exact cause of colon polyps is not known. What increases the risk? You are more likely to develop this condition if you:  Have a family history of colon cancer or colon polyps.  Are older than 24 or older than 45 if you are African American.  Have inflammatory bowel disease, such as ulcerative colitis or Crohn's disease.  Have certain hereditary conditions, such as: ? Familial adenomatous polyposis. ? Lynch syndrome. ? Turcot syndrome. ? Peutz-Jeghers syndrome.  Are overweight.  Smoke cigarettes.  Do not get enough exercise.  Drink too much alcohol.  Eat a diet that is high in fat and red meat and low in fiber.  Had childhood cancer that was treated with abdominal radiation. What are the signs or symptoms? Most polyps do not cause symptoms. If you have symptoms, they may include:  Blood coming from your rectum when having a bowel movement.  Blood in your stool. The stool may look dark red or black.  Abdominal pain.  A change in bowel habits, such as constipation or diarrhea. How is this diagnosed? This condition is diagnosed with a colonoscopy. This is a procedure in which a lighted, flexible scope is inserted into the anus and then passed into the colon to examine the area. Polyps are sometimes found when a colonoscopy is done as part of routine cancer screening tests. How is this treated? Treatment for this  condition involves removing any polyps that are found. Most polyps can be removed during a colonoscopy. Those polyps will then be tested for cancer. Additional treatment may be needed depending on the results of testing. Follow these instructions at home: Lifestyle  Maintain a healthy weight, or lose weight if recommended by your health care provider.  Exercise every day or as told by your health care provider.  Do not use any products that contain nicotine or tobacco, such as cigarettes and e-cigarettes. If you need help quitting, ask your health care provider.  If you drink alcohol, limit how much you have: ? 0-1 drink a day for women. ? 0-2 drinks a day for men.  Be aware of how much alcohol is in your drink. In the [place] one drink equals one 12 oz bottle of beer (355 mL), one 5 oz glass of wine (148 mL), or one 1 oz shot of hard liquor (44 mL). Eating and drinking   Eat foods that are high in fiber, such as fruits, vegetables, and whole grains.  Eat foods that are high in calcium and vitamin D, such as milk, cheese, yogurt, eggs, liver, fish, and broccoli.  Limit foods that are high in fat, such as fried foods and desserts.  Limit the amount of red meat and processed meat you eat, such as hot dogs, sausage, bacon, and lunch meats. General instructions  Keep all follow-up visits as told by your health care provider. This is important. ? This includes having regularly scheduled colonoscopies. ? Talk to your  health care provider about when you need a colonoscopy. Contact a health care provider if:  You have new or worsening bleeding during a bowel movement.  You have new or increased blood in your stool.  You have a change in bowel habits.  You lose weight for no known reason. Summary  Polyps are tissue growths inside the body. Polyps can grow in many places, including the colon.  Most colon polyps are noncancerous (benign), but some can become cancerous over  time.  This condition is diagnosed with a colonoscopy.  Treatment for this condition involves removing any polyps that are found. Most polyps can be removed during a colonoscopy. This information is not intended to replace advice given to you by your health care provider. Make sure you discuss any questions you have with your health care provider. Document Revised: 06/17/2017 Document Reviewed: 06/17/2017 Elsevier Patient Education  [place].    Colonoscopy, Adult, Care After This sheet gives you information about how to care for yourself after your procedure. Your doctor may also give you more specific instructions. If you have problems or questions, call your doctor. What can I expect after the procedure? After the procedure, it is common to have:  A small amount of blood in your poop (stool) for 24 hours.  Some gas.  Mild cramping or bloating in your belly (abdomen). Follow these instructions at home: Eating and drinking   Drink enough fluid to keep your pee (urine) pale yellow.  Follow instructions from your doctor about what you cannot eat or drink.  Return to your normal diet as told by your doctor. Avoid heavy or fried foods that are hard to digest. Activity  Rest as told by your doctor.  Do not sit for a long time without moving. Get up to take short walks every 1-2 hours. This is important. Ask for help if you feel weak or unsteady.  Return to your normal activities as told by your doctor. Ask your doctor what activities are safe for you. To help cramping and bloating:   Try walking around.  Put heat on your belly as told by your doctor. Use the heat source that your doctor recommends, such as a moist heat pack or a heating pad. ? Put a towel between your skin and the heat source. ? Leave the heat on for 20-30 minutes. ? Remove the heat if your skin turns bright red. This is very important if you are unable to feel pain, heat, or cold. You may have a  greater risk of getting burned. General instructions  For the first 24 hours after the procedure: ? Do not drive or use machinery. ? Do not sign important documents. ? Do not drink alcohol. ? Do your daily activities more slowly than normal. ? Eat foods that are soft and easy to digest.  Take over-the-counter or prescription medicines only as told by your doctor.  Keep all follow-up visits as told by your doctor. This is important. Contact a doctor if:  You have blood in your poop 2-3 days after the procedure. Get help right away if:  You have more than a small amount of blood in your poop.  You see large clumps of tissue (blood clots) in your poop.  Your belly is swollen.  You feel like you may vomit (nauseous).  You vomit.  You have a fever.  You have belly pain that gets worse, and medicine does not help your pain. Summary  After the procedure, it  is common to have a small amount of blood in your poop. You may also have mild cramping and bloating in your belly.  For the first 24 hours after the procedure, do not drive or use machinery, do not sign important documents, and do not drink alcohol.  Get help right away if you have a lot of blood in your poop, feel like you may vomit, have a fever, or have more belly pain. This information is not intended to replace advice given to you by your health care provider. Make sure you discuss any questions you have with your health care provider. Document Revised: 09/26/2018 Document Reviewed: 09/26/2018 Elsevier Patient Education  [place].

## 2020-01-18 NOTE — Telephone Encounter (Signed)
Patient had EGD done today

## 2020-01-18 NOTE — Op Note (Signed)
[facility] Patient Name: Hunter Sosa Procedure Date: 01/18/2020 2:23 PM MRN: [id number] Date of Birth: Apr 15, 1965 Attending MD: Hildred Laser , MD CSN: [id number] Age: 54 Admit Type: Outpatient Procedure:                Colonoscopy Indications:              Screening in patient at increased risk: Colorectal                            cancer in mother before age 39, Screening in                            patient at increased risk: Colorectal cancer in                            father before age 59 Providers:                Hildred Laser, MD, Janeece Riggers, RN, Lambert Mody, Kristine L. Risa Grill, Technician,                            Aram Candela Referring MD:             Doree Albee, MD Medicines:                Meperidine 75 mg IV, Midazolam 9 mg IV Complications:            No immediate complications. Estimated Blood Loss:     Estimated blood loss was minimal. Procedure:                Pre-Anesthesia Assessment:                           - Prior to the procedure, a History and Physical                            was performed, and patient medications and                            allergies were reviewed. The patient's tolerance of                            previous anesthesia was also reviewed. The risks                            and benefits of the procedure and the sedation                            options and risks were discussed with the patient.                            All questions were answered, and informed consent  was obtained. Prior Anticoagulants: The patient has                            taken no previous anticoagulant or antiplatelet                            agents except for aspirin. ASA Grade Assessment: II                            - A patient with mild systemic disease. After                            reviewing the risks and benefits, the patient was                             deemed in satisfactory condition to undergo the                            procedure.                           After obtaining informed consent, the colonoscope                            was passed under direct vision. Throughout the                            procedure, the patient's blood pressure, pulse, and                            oxygen saturations were monitored continuously. The                            [id number]-[id number] ([id number]) was introduced through the                            anus and advanced to the the cecum, identified by                            appendiceal orifice and ileocecal valve. The                            colonoscopy was performed without difficulty. The                            patient tolerated the procedure well. The quality                            of the bowel preparation was good except the                            ascending colon was fair. The ileocecal valve,  appendiceal orifice, and rectum were photographed. Scope In: 2:37:07 PM Scope Out: 3:02:06 PM Scope Withdrawal Time: 0 hours 20 minutes 23 seconds  Total Procedure Duration: 0 hours 24 minutes 59 seconds  Findings:      The perianal and digital rectal examinations were normal.      A 6 mm polyp was found in the mid sigmoid colon. The polyp was       semi-pedunculated. The polyp was removed with a cold snare. Resection       and retrieval were complete. The pathology specimen was placed into       Bottle Number 1.      Two polyps were found in the recto-sigmoid colon. The polyps were 4 to 7       mm in size. These polyps were removed with a cold snare. Resection and       retrieval were complete. The pathology specimen was placed into Bottle       Number 2.      The retroflexed view of the distal rectum and anal verge was normal and       showed no anal or rectal abnormalities. Impression:               - One 6 mm polyp in the mid sigmoid colon, removed                             with a cold snare. Resected and retrieved.                           - Two 4 to 7 mm polyps at the recto-sigmoid colon,                            removed with a cold snare. Resected and retrieved. Moderate Sedation:      Moderate (conscious) sedation was administered by the endoscopy nurse       and supervised by the endoscopist. The following parameters were       monitored: oxygen saturation, heart rate, blood pressure, CO2       capnography and response to care. Total physician intraservice time was       33 minutes. Recommendation:           - Patient has a contact number available for                            emergencies. The signs and symptoms of potential                            delayed complications were discussed with the                            patient. Return to normal activities tomorrow.                            Written discharge instructions were provided to the                            patient.                           -  Resume previous diet today.                           - Continue present medications.                           - No aspirin, ibuprofen, naproxen, or other                            non-steroidal anti-inflammatory drugs for 2 days.                           - Await pathology results.                           - Repeat colonoscopy for surveillance based on                            pathology results. Procedure Code(s):        --- Professional ---                           [phone], Colonoscopy, flexible; with removal of                            tumor(s), polyp(s), or other lesion(s) by snare                            technique                           99153, Moderate sedation; each additional 15                            minutes intraservice time                           G0500, Moderate sedation services provided by the                            same physician or other qualified health care                             professional performing a gastrointestinal                            endoscopic service that sedation supports,                            requiring the presence of an independent trained                            observer to assist in the monitoring of the                            patient's level of consciousness and physiological  status; initial 15 minutes of intra-service time;                            patient age 59 years or older (additional time may                            be reported with [id number], as appropriate) Diagnosis Code(s):        --- Professional ---                           K63.5, Polyp of colon                           Z80.0, Family history of malignant neoplasm of                            digestive organs CPT copyright 2019 American Medical Association. All rights reserved. The codes documented in this report are preliminary and upon coder review may  be revised to meet current compliance requirements. Hildred Laser, MD Hildred Laser, MD 01/18/2020 3:11:03 PM This report has been signed electronically. Number of Addenda: 0

## 2020-01-18 NOTE — H&P (Signed)
Hunter Sosa is an 54 y.o. male.   Chief Complaint: Patient is here for esophagogastroduodenoscopy and colonoscopy. HPI: Patient is 54 year old Caucasian male who has chronic GERD seen in the office last month for symptoms of GERD and dysphagia.  He was begun on PPI.  He says his symptoms have completely resolved.  He is not having any dysphagia and therefore not interested in undergoing esophagogastroduodenoscopy.  However he is interested to proceed with screening colonoscopy.  He denies abdominal pain change in bowel habits or rectal bleeding. Both parents had colon carcinoma in their 17s.  Mother is still living.  Father died of lung cancer at 24.  Mother is 13 years old. Patient does not take aspirin or NSAIDs.  Past Medical History:  Diagnosis Date  . 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 Vitamin D deficiency disease 01/31/2019    Past Surgical History:  Procedure Laterality Date  . HERNIA REPAIR Left    At age 17 , then again in 2004    Family History  Problem Relation Age of Onset  . Colon cancer Mother   . Colon cancer Father   . Crohn's disease Father   . Hypertension Father    Social History:  reports that he quit smoking about 20 years ago. He quit smokeless tobacco use about 8 years ago. He reports current alcohol use of about 6.0 standard drinks of alcohol per week. He reports current drug use. Drug: Marijuana.  Allergies: No Known Allergies  Medications Prior to Admission  Medication Sig Dispense Refill  . albuterol (VENTOLIN HFA) 108 (90 Base) MCG/ACT inhaler Inhale 2 puffs into the lungs every 6 (six) hours as needed for wheezing or shortness of breath. 18 g 3  . Cholecalciferol (VITAMIN D-3) 125 MCG (5000 UT) TABS Take 10,000 Units by mouth daily.     . pantoprazole (PROTONIX) 40 MG tablet Take 1 tablet (40 mg total) by mouth daily. 30 tablet 3  . Testosterone Cypionate 200 MG/ML SOLN Inject 100 mg as directed once a week. (Patient taking differently: Inject 100 mg  as directed once a week. Monday) 10 mL 1    Results for orders placed or performed during the hospital encounter of 01/17/20 (from the past 48 hour(s))  SARS CORONAVIRUS 2 (TAT 6-24 HRS) Nasopharyngeal Nasopharyngeal Swab     Status: None   Collection Time: 01/17/20 12:50 PM   Specimen: Nasopharyngeal Swab  Result Value Ref Range   SARS Coronavirus 2 NEGATIVE NEGATIVE    Comment: (NOTE) SARS-CoV-2 target nucleic acids are NOT DETECTED.  The SARS-CoV-2 RNA is generally detectable in upper and lower respiratory specimens during the acute phase of infection. Negative results do not preclude SARS-CoV-2 infection, do not rule out co-infections with other pathogens, and should not be used as the sole basis for treatment or other patient management decisions. Negative results must be combined with clinical observations, patient history, and epidemiological information. The expected result is Negative.  Fact Sheet for Patients: SugarRoll.be  Fact Sheet for Healthcare Providers: https://www.woods-mathews.com/  This test is not yet approved or cleared by the [place] FDA and  has been authorized for detection and/or diagnosis of SARS-CoV-2 by FDA under an Emergency Use Authorization (EUA). This EUA will remain  in effect (meaning this test can be used) for the duration of the COVID-19 declaration under Se ction 564(b)(1) of the Act, 21 U.S.C. section 360bbb-3(b)(1), unless the authorization is terminated or revoked sooner.  Performed at [facility], [place],  [place] [address]    No results found.  Review of Systems  Blood pressure (!) 165/100, temperature 99 F (37.2 C), temperature source Oral, resp. rate 16, height 5\' 10"  (1.778 m), weight 99.8 kg, SpO2 97 %. Physical Exam HENT:     Mouth/Throat:     Mouth: Mucous membranes are moist.     Pharynx: Oropharynx is clear.  Eyes:     General: No scleral  icterus.    Conjunctiva/sclera: Conjunctivae normal.  Cardiovascular:     Rate and Rhythm: Normal rate and regular rhythm.     Heart sounds: Normal heart sounds. No murmur heard.   Pulmonary:     Effort: Pulmonary effort is normal.     Breath sounds: Normal breath sounds.  Abdominal:     General: There is no distension.     Palpations: Abdomen is soft. There is no mass.     Tenderness: There is no abdominal tenderness.  Musculoskeletal:        General: No swelling.     Cervical back: Neck supple. No rigidity.  Skin:    General: Skin is warm and dry.  Neurological:     Mental Status: He is alert.      Assessment/Plan  GERD and history of dysphagia. Patient symptoms have resolved with PPI and he is not interested in EGD anymore. Screening colonoscopy.  Both parents had colon carcinoma in their 10s.  Hildred Laser, MD 01/18/2020, 2:26 PM

## 2020-01-22 LAB — SURGICAL PATHOLOGY

## 2020-01-24 ENCOUNTER — Encounter (HOSPITAL_COMMUNITY): Payer: Self-pay | Admitting: Internal Medicine

## 2020-01-31 ENCOUNTER — Encounter (INDEPENDENT_AMBULATORY_CARE_PROVIDER_SITE_OTHER): Payer: Self-pay | Admitting: Internal Medicine

## 2020-01-31 ENCOUNTER — Other Ambulatory Visit: Payer: Self-pay

## 2020-01-31 ENCOUNTER — Ambulatory Visit (INDEPENDENT_AMBULATORY_CARE_PROVIDER_SITE_OTHER): Payer: BC Managed Care – PPO | Admitting: Internal Medicine

## 2020-01-31 VITALS — BP 148/80 | HR 70 | Temp 97.7°F | Ht 71.0 in | Wt 226.2 lb

## 2020-01-31 DIAGNOSIS — E559 Vitamin D deficiency, unspecified: Secondary | ICD-10-CM | POA: Diagnosis not present

## 2020-01-31 DIAGNOSIS — R030 Elevated blood-pressure reading, without diagnosis of hypertension: Secondary | ICD-10-CM | POA: Diagnosis not present

## 2020-01-31 DIAGNOSIS — E291 Testicular hypofunction: Secondary | ICD-10-CM

## 2020-01-31 NOTE — Progress Notes (Signed)
Metrics: Intervention Frequency ACO  Documented Smoking Status Yearly  Screened one or more times in 24 months  Cessation Counseling or  Active cessation medication Past 24 months  Past 24 months   Guideline developer: UpToDate (See UpToDate for funding source) Date Released: 2014       Wellness Office Visit  Subjective:  Patient ID: Hunter Sosa, male    DOB: 1966-03-10  Age: 55 y.o. MRN: [id number]  CC: This man comes in for follow-up regarding his testosterone therapy and vitamin D deficiency. HPI  He underwent colonoscopy recently which showed benign polyps and he will follow-up in 5 years for repeat colonoscopy. His upper GI symptoms have resolved with the use of Protonix and therefore EGD was not done. He continues to take vitamin D3 supplementation for vitamin D deficiency and is tolerating testosterone therapy once a week. Past Medical History:  Diagnosis Date  . 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 Vitamin D deficiency disease 01/31/2019   Past Surgical History:  Procedure Laterality Date  . COLONOSCOPY N/A 01/18/2020   Procedure: COLONOSCOPY;  Surgeon: Rogene Houston, MD;  Location: [facility];  Service: Endoscopy;  Laterality: N/A;  130  . HERNIA REPAIR Left    At age 51 , then again in 2004  . POLYPECTOMY  01/18/2020   Procedure: POLYPECTOMY;  Surgeon: Rogene Houston, MD;  Location: [facility];  Service: Endoscopy;;     Family History  Problem Relation Age of Onset  . Colon cancer Mother   . Colon cancer Father   . Crohn's disease Father   . 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   Social History   Tobacco Use  . Smoking status: Former Smoker    Quit date: 01/01/2000    Years since quitting: 20.0  . Smokeless tobacco: Former Systems developer    Quit  date: 01/01/2012  Substance Use Topics  . Alcohol use: Yes    Alcohol/week: 6.0 standard drinks    Types: 6 Cans of beer per week    Comment: patient drinks 6 beers a day    Current Meds  Medication Sig  . albuterol (VENTOLIN HFA) 108 (90 Base) MCG/ACT inhaler Inhale 2 puffs into the lungs every 6 (six) hours as needed for wheezing or shortness of breath.  . Cholecalciferol (VITAMIN D-3) 125 MCG (5000 UT) TABS Take 10,000 Units by mouth daily.   . pantoprazole (PROTONIX) 40 MG tablet Take 1 tablet (40 mg total) by mouth daily.  . Testosterone Cypionate 200 MG/ML SOLN Inject 100 mg as directed once a week. (Patient taking differently: Inject 100 mg as directed once a week. Monday)      Depression screen [facility] 2/9 01/31/2020 07/31/2019  Decreased Interest 0 0  Down, Depressed, Hopeless 0 0  PHQ - 2 Score 0 0  Altered sleeping 0 -  Tired, decreased energy 0 -  Change in appetite 0 -  Feeling bad or failure about yourself  0 -  Trouble concentrating 0 -  Moving slowly or fidgety/restless 0 -  Suicidal thoughts 0 -  PHQ-9 Score 0 -  Difficult doing work/chores Not difficult at all -     Objective:   Today's Vitals: BP (!) 148/80   Pulse 70   Temp 97.7 F (36.5 C) (Temporal)   Ht  5\' 11"  (1.803 m)   Wt 226 lb 3.2 oz (102.6 kg)   SpO2 97%   BMI 31.55 kg/m  Vitals with BMI 01/31/2020 01/18/2020 01/18/2020  Height 5\' 11"  - -  Weight 226 lbs 3 oz - -  BMI 95.07 - -  Systolic 225 750 518  Diastolic 80 97 90  Pulse 70 - 70     Physical Exam  He looks systemically well.  He remains obese.  Blood pressure borderline elevated today.  Alert and orientated without any focal neurological signs.     Assessment   1. Testicular failure   2. Vitamin D deficiency disease   3. Elevated blood pressure reading       Tests ordered No orders of the defined types were placed in this encounter.    Plan: 1. He will continue with testosterone therapy as before. 2. He will  continue with vitamin D3 supplementation as before. 3. He will monitor blood pressure readings at home twice a day for the next couple of weeks and let me know if there is elevated. 4. Follow-up in 6 months for an annual physical exam.   No orders of the defined types were placed in this encounter.   Doree Albee, MD

## 2020-02-15 ENCOUNTER — Telehealth (INDEPENDENT_AMBULATORY_CARE_PROVIDER_SITE_OTHER): Payer: Self-pay | Admitting: Internal Medicine

## 2020-02-27 ENCOUNTER — Encounter (INDEPENDENT_AMBULATORY_CARE_PROVIDER_SITE_OTHER): Payer: Self-pay | Admitting: *Deleted

## 2020-02-29 NOTE — Telephone Encounter (Signed)
Done

## 2020-04-03 ENCOUNTER — Other Ambulatory Visit (INDEPENDENT_AMBULATORY_CARE_PROVIDER_SITE_OTHER): Payer: Self-pay

## 2020-04-03 ENCOUNTER — Telehealth (INDEPENDENT_AMBULATORY_CARE_PROVIDER_SITE_OTHER): Payer: Self-pay

## 2020-04-03 NOTE — Telephone Encounter (Signed)
On 01/2019 & 07/2019 pt called nd asked if he would be ableto talk with dr Anastasio Champion on the status of 2 bills, and physicals he was billed for. Not covered by his insurance. Wants to see if he can help explain or help correct this errors. Per his BSBS plan.

## 2020-04-04 ENCOUNTER — Encounter (INDEPENDENT_AMBULATORY_CARE_PROVIDER_SITE_OTHER): Payer: Self-pay

## 2020-04-04 MED ORDER — TESTOSTERONE CYPIONATE 200 MG/ML IJ SOLN: 100.0000 mg | INTRAMUSCULAR | 1 refills | Status: AC

## 2020-04-04 MED ORDER — ALBUTEROL SULFATE HFA 108 (90 BASE) MCG/ACT IN AERS: 2.0000 | INHALATION_SPRAY | Freq: Four times a day (QID) | RESPIRATORY_TRACT | 3 refills | Status: DC | PRN

## 2020-04-04 NOTE — Telephone Encounter (Signed)
I am not sure I can help him.  He will need to discuss with billing department.

## 2020-04-04 NOTE — Telephone Encounter (Signed)
Sent a Pharmacist, community message of information to call billing.

## 2020-06-18 ENCOUNTER — Other Ambulatory Visit (INDEPENDENT_AMBULATORY_CARE_PROVIDER_SITE_OTHER): Payer: Self-pay

## 2020-06-18 DIAGNOSIS — K219 Gastro-esophageal reflux disease without esophagitis: Secondary | ICD-10-CM

## 2020-06-18 MED ORDER — PANTOPRAZOLE SODIUM 40 MG PO TBEC: 40.0000 mg | DELAYED_RELEASE_TABLET | Freq: Every day | ORAL | 3 refills | Status: DC

## 2020-06-19 ENCOUNTER — Telehealth (INDEPENDENT_AMBULATORY_CARE_PROVIDER_SITE_OTHER): Payer: Self-pay

## 2020-06-19 NOTE — Telephone Encounter (Signed)
He is not on any blood pressure medication that I have prescribed.  Please ask him to clarify what he is talking about.  Thanks.

## 2020-07-31 ENCOUNTER — Encounter (INDEPENDENT_AMBULATORY_CARE_PROVIDER_SITE_OTHER): Payer: BC Managed Care – PPO | Admitting: Internal Medicine

## 2020-08-28 ENCOUNTER — Ambulatory Visit (INDEPENDENT_AMBULATORY_CARE_PROVIDER_SITE_OTHER): Payer: BC Managed Care – PPO | Admitting: Internal Medicine

## 2020-08-28 ENCOUNTER — Encounter (INDEPENDENT_AMBULATORY_CARE_PROVIDER_SITE_OTHER): Payer: Self-pay | Admitting: Internal Medicine

## 2020-08-28 ENCOUNTER — Other Ambulatory Visit: Payer: Self-pay

## 2020-08-28 VITALS — BP 136/84 | HR 63 | Temp 98.0°F | Resp 18 | Ht 68.0 in | Wt 226.0 lb

## 2020-08-28 DIAGNOSIS — E559 Vitamin D deficiency, unspecified: Secondary | ICD-10-CM | POA: Diagnosis not present

## 2020-08-28 DIAGNOSIS — E291 Testicular hypofunction: Secondary | ICD-10-CM | POA: Diagnosis not present

## 2020-08-28 DIAGNOSIS — Z0000 Encounter for general adult medical examination without abnormal findings: Secondary | ICD-10-CM | POA: Diagnosis not present

## 2020-08-28 DIAGNOSIS — Z125 Encounter for screening for malignant neoplasm of prostate: Secondary | ICD-10-CM | POA: Diagnosis not present

## 2020-08-28 DIAGNOSIS — Z13220 Encounter for screening for lipoid disorders: Secondary | ICD-10-CM

## 2020-08-28 DIAGNOSIS — E785 Hyperlipidemia, unspecified: Secondary | ICD-10-CM | POA: Diagnosis not present

## 2020-08-28 NOTE — Progress Notes (Signed)
AM:119/77  PM:136/89

## 2020-08-28 NOTE — Progress Notes (Signed)
Chief Complaint: This pleasant 55 year old man comes in for an annual physical exam. HPI: He is doing very well.  He has had episodes of increased blood pressure readings but this is usually related to stress.  He has decided that where he works he is going to do his work and go home and try not to get too stressed.  This seems to be working well. He continues on testosterone therapy once a week as before. He continues with vitamin D3 supplementation daily. He continues with Protonix but does not need to take it every day now.  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   Vitamin D deficiency disease 01/31/2019   Past Surgical History:  Procedure Laterality Date   COLONOSCOPY N/A 01/18/2020   Procedure: COLONOSCOPY;  Surgeon: Rogene Houston, MD;  Location: [facility];  Service: Endoscopy;  Laterality: N/A;  130   HERNIA REPAIR Left    At age 49 , then again in 2004   POLYPECTOMY  01/18/2020   Procedure: POLYPECTOMY;  Surgeon: Rogene Houston, MD;  Location: [facility];  Service: Endoscopy;;     Social History   Social History Narrative   Single but lives with girlfriend(works in [employer]) for 16 years in grandmother's house.Previously married 1.5 years and then 7 years.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    Social History   Tobacco Use   Smoking status: Former    Pack years: 0.00    Types: Cigarettes    Quit date: 01/01/2000    Years since quitting: 20.6   Smokeless tobacco: Former    Quit date: 01/01/2012  Substance Use Topics   Alcohol use: Yes    Alcohol/week: 6.0 standard drinks    Types: 6 Cans of beer per week    Comment: patient drinks 6 beers a day      Allergies: No Known Allergies   Current Meds  Medication Sig   albuterol (VENTOLIN HFA) 108 (90 Base) MCG/ACT inhaler Inhale 2 puffs into the lungs every 6 (six) hours as needed for wheezing or shortness of breath.   Cholecalciferol (VITAMIN D-3) 125 MCG (5000  UT) TABS Take 10,000 Units by mouth daily.    pantoprazole (PROTONIX) 40 MG tablet Take 1 tablet (40 mg total) by mouth daily.   Testosterone Cypionate 200 MG/ML SOLN Inject 100 mg as directed once a week.     [place] Office Visit from 01/31/2020 in [place]  PHQ-9 Total Score 0       DJT:TSVXB from the symptoms mentioned above,there are no other symptoms referable to all systems reviewed.       Physical Exam: Blood pressure 136/84, pulse 63, temperature 98 F (36.7 C), temperature source Temporal, resp. rate 18, height 5\' 8"  (1.727 m), weight 226 lb (102.5 kg), SpO2 98 %. Vitals with BMI 08/28/2020 01/31/2020 01/18/2020  Height 5\' 8"  5\' 11"  -  Weight 226 lbs 226 lbs 3 oz -  BMI 93.90 30.09 -  Systolic 233 007 622  Diastolic 84 80 97  Pulse 63 70 -      He looks systemically well.  He remains obese.  Blood pressure is slightly elevated today. General: Alert, cooperative, and appears to be the stated age.No pallor.  No jaundice.  No clubbing. Head: Normocephalic Eyes: Sclera white, pupils equal and reactive to light, red reflex x 2,  Ears: Normal bilaterally Oral cavity: Lips, mucosa, and tongue normal: Teeth and gums normal Neck: No  adenopathy, supple, symmetrical, trachea midline, and thyroid does not appear enlarged Respiratory: Clear to auscultation bilaterally.No wheezing, crackles or bronchial breathing. Cardiovascular: Heart sounds are present and appear to be normal without murmurs or added sounds.  No carotid bruits.  Peripheral pulses are present and equal bilaterally.: Gastrointestinal:positive bowel sounds, no hepatosplenomegaly.  No masses felt.No tenderness. Skin: Clear, No rashes noted.No worrisome skin lesions seen. Neurological: Grossly intact without focal findings, cranial nerves II through XII intact, muscle strength equal bilaterally Musculoskeletal: No acute joint abnormalities noted.Full range of movement noted with  joints. Psychiatric: Affect appropriate, non-anxious.    Assessment  1. Routine general medical examination at a health care facility   2. Testicular failure   3. Vitamin D deficiency disease   4. Screening for lipoid disorders   5. Special screening for malignant neoplasm of prostate     Tests Ordered:   Orders Placed This Encounter  Procedures   CBC   COMPLETE METABOLIC PANEL WITH GFR   PSA, Total with Reflex to PSA, Free   Lipid panel      Plan  55 year old man on testosterone therapy, obese.  He sometimes has elevated blood pressure readings but is not keen to go on any antihypertensive medications whatsoever. 2.  Blood work is ordered. 3.  I will see him in 6 months for follow-up.     No orders of the defined types were placed in this encounter.    Bethania Schlotzhauer C Dawnielle Christiana   08/28/2020, 5:18 PM

## 2020-08-29 LAB — COMPLETE METABOLIC PANEL WITH GFR
AG Ratio: 2 (calc) (ref 1.0–2.5)
ALT: 24 U/L (ref 9–46)
AST: 26 U/L (ref 10–35)
Albumin: 4.3 g/dL (ref 3.6–5.1)
Alkaline phosphatase (APISO): 53 U/L (ref 35–144)
BUN: 15 mg/dL (ref 7–25)
CO2: 29 mmol/L (ref 20–32)
Calcium: 9.4 mg/dL (ref 8.6–10.3)
Chloride: 102 mmol/L (ref 98–110)
Creat: 1.22 mg/dL (ref 0.70–1.33)
GFR, Est African American: 77 mL/min/{1.73_m2} (ref >=60)
GFR, Est Non African American: 67 mL/min/{1.73_m2} (ref >=60)
Globulin: 2.2 g/dL (calc) (ref 1.9–3.7)
Glucose, Bld: 86 mg/dL (ref 65–139)
Potassium: 4.2 mmol/L (ref 3.5–5.3)
Sodium: 139 mmol/L (ref 135–146)
Total Bilirubin: 0.6 mg/dL (ref 0.2–1.2)
Total Protein: 6.5 g/dL (ref 6.1–8.1)

## 2020-08-29 LAB — CBC
HCT: 50.4 % — ABNORMAL HIGH (ref 38.5–50.0)
Hemoglobin: 16.7 g/dL (ref 13.2–17.1)
MCH: 30 pg (ref 27.0–33.0)
MCHC: 33.1 g/dL (ref 32.0–36.0)
MCV: 90.6 fL (ref 80.0–100.0)
MPV: 10.2 fL (ref 7.5–12.5)
Platelets: 252 10*3/uL (ref 140–400)
RBC: 5.56 10*6/uL (ref 4.20–5.80)
RDW: 13.6 % (ref 11.0–15.0)
WBC: 8.6 10*3/uL (ref 3.8–10.8)

## 2020-08-29 LAB — LIPID PANEL
Cholesterol: 199 mg/dL (ref <200)
HDL: 52 mg/dL (ref >=40)
LDL Cholesterol (Calc): 114 mg/dL (calc) — ABNORMAL HIGH
Non-HDL Cholesterol (Calc): 147 mg/dL (calc) — ABNORMAL HIGH (ref <130)
Total CHOL/HDL Ratio: 3.8 (calc) (ref <5.0)
Triglycerides: 210 mg/dL — ABNORMAL HIGH (ref <150)

## 2020-08-29 LAB — PSA, TOTAL WITH REFLEX TO PSA, FREE: PSA, Total: 0.7 ng/mL (ref <=4.0)

## 2020-10-23 ENCOUNTER — Telehealth (INDEPENDENT_AMBULATORY_CARE_PROVIDER_SITE_OTHER): Payer: Self-pay

## 2020-12-30 ENCOUNTER — Other Ambulatory Visit (INDEPENDENT_AMBULATORY_CARE_PROVIDER_SITE_OTHER): Payer: Self-pay | Admitting: Gastroenterology

## 2020-12-30 DIAGNOSIS — K219 Gastro-esophageal reflux disease without esophagitis: Secondary | ICD-10-CM

## 2021-02-26 ENCOUNTER — Telehealth (INDEPENDENT_AMBULATORY_CARE_PROVIDER_SITE_OTHER): Payer: Self-pay | Admitting: *Deleted

## 2021-02-26 NOTE — Telephone Encounter (Signed)
Fax received from [facility] on scales for albuterol inhaler precribed by patients pcp requesting refills. Tried to call patient on 02/24/21 and left message to return call and I did not receive a call back. I sent fax back to [facility] with a note on it that med is not prescribed by pt's GI and to send to pcp office.

## 2021-02-27 ENCOUNTER — Ambulatory Visit
Admission: EM | Admit: 2021-02-27 | Discharge: 2021-02-27 | Disposition: A | Discharge status: Home / Self Care | Payer: BC Managed Care – PPO | Attending: Urgent Care | Admitting: Urgent Care

## 2021-02-27 ENCOUNTER — Ambulatory Visit (INDEPENDENT_AMBULATORY_CARE_PROVIDER_SITE_OTHER): Payer: BC Managed Care – PPO | Admitting: Internal Medicine

## 2021-02-27 ENCOUNTER — Other Ambulatory Visit: Payer: Self-pay

## 2021-02-27 ENCOUNTER — Encounter: Payer: Self-pay | Admitting: Emergency Medicine

## 2021-02-27 DIAGNOSIS — J4530 Mild persistent asthma, uncomplicated: Secondary | ICD-10-CM | POA: Diagnosis not present

## 2021-02-27 DIAGNOSIS — R030 Elevated blood-pressure reading, without diagnosis of hypertension: Secondary | ICD-10-CM | POA: Diagnosis not present

## 2021-02-27 MED ORDER — ALBUTEROL SULFATE HFA 108 (90 BASE) MCG/ACT IN AERS: 2.0000 | INHALATION_SPRAY | Freq: Four times a day (QID) | RESPIRATORY_TRACT | 3 refills | Status: AC | PRN

## 2021-02-27 NOTE — ED Provider Notes (Signed)
[facility]   MRN: [id number] DOB: 04-02-1965  Subjective:   Hunter Sosa is a 55 y.o. male presenting for refill on his albuterol inhaler for his asthma.  Patient is not a smoker.  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  Does not have daily use.  No chest pain, shortness of breath or wheezing.  Regarding his blood pressure, patient has never been diagnosed with hypertension.  Has never had to take blood pressure medications.  Admits that he has been eating a lot of pork sausage lately.  No history of heart disease, heart attacks, stroke.  He is trying to establish care with a new PCP.  Would like recommendations.  No current facility-administered medications for this encounter.  Current Outpatient Medications:    albuterol (VENTOLIN HFA) 108 (90 Base) MCG/ACT inhaler, Inhale 2 puffs into the lungs every 6 (six) hours as needed for wheezing or shortness of breath., Disp: 18 g, Rfl: 3   Cholecalciferol (VITAMIN D-3) 125 MCG (5000 UT) TABS, Take 10,000 Units by mouth daily. 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   Vitamin D deficiency disease 01/31/2019     Past Surgical History:  Procedure Laterality Date   COLONOSCOPY N/A 01/18/2020   Procedure: COLONOSCOPY;  Surgeon: Rogene Houston, MD;  [facility];  Service: Endoscopy;  Laterality: N/A;  130   HERNIA REPAIR Left    At age 89 , then again in 2004   POLYPECTOMY  01/18/2020   Procedure: POLYPECTOMY;  Surgeon: Rogene Houston, MD;  [facility]: [facility];  Service: Endoscopy;;    Family History  Problem Relation Age of Onset   Colon cancer Mother    Colon cancer Father    Crohn's disease Father     Hypertension Father     Social History   Tobacco Use   Smoking status: Former    Types: Cigarettes    Quit date: 01/01/2000    Years since quitting: 21.1   Smokeless tobacco: Former    Quit date: 01/01/2012  Vaping Use   Vaping Use: Never used  Substance Use Topics   Alcohol use: Yes    Alcohol/week: 6.0 standard drinks    Types: 6 Cans of beer per week    Comment: patient drinks 6 beers a day   Drug use: Yes    Types: Marijuana    Comment: Patient states that he uses daily.    ROS   Objective:   Vitals: BP (!) 170/112 (BP Location: Right Arm)    Pulse 60    Temp 98.2 F (36.8 C) (Oral)    Resp 18    SpO2 95%   BP Readings from Last 3 Encounters:  02/27/21 (!) 170/112  08/28/20 136/84  01/31/20 (!) 148/80    Physical Exam Constitutional:      General: He is not in acute distress.    Appearance: Normal appearance. He is well-developed. He is not ill-appearing, toxic-appearing or diaphoretic.  HENT:     Head: Normocephalic and atraumatic.     Right Ear: External ear normal.     Left Ear: External ear normal.  Nose: Nose normal.     Mouth/Throat:     Mouth: Mucous membranes are moist.     Pharynx: Oropharynx is clear.  Eyes:     General: No scleral icterus.    Extraocular Movements: Extraocular movements intact.     Pupils: Pupils are equal, round, and reactive to light.  Cardiovascular:     Rate and Rhythm: Normal rate and regular rhythm.     Heart sounds: Normal heart sounds. No murmur heard.   No friction rub. No gallop.  Pulmonary:     Effort: Pulmonary effort is normal. No respiratory distress.     Breath sounds: Normal breath sounds. No stridor. No wheezing, rhonchi or rales.  Neurological:     Mental Status: He is alert and oriented to person, place, and time.     Cranial Nerves: No cranial nerve deficit.     Motor: No weakness.     Coordination: Coordination normal.     Gait: Gait normal.     Comments: Negative Romberg and pronator  drift.  Psychiatric:        Mood and Affect: Mood normal.        Behavior: Behavior normal.        Thought Content: Thought content normal.    Assessment and Plan :   PDMP not reviewed this encounter.  1. Mild persistent asthma without complication   2. Elevated blood pressure reading without diagnosis of hypertension    Refilled his albuterol inhaler for him to use as needed.  Monitor blood pressure, recommended he practice a low-sodium diet.  Follow-up with a new PCP, information provided to him for this. Counseled patient on potential for adverse effects with medications prescribed/recommended today, ER and return-to-clinic precautions discussed, patient verbalized understanding.    Jaynee Eagles, [place] 02/27/21 [phone]

## 2021-02-27 NOTE — ED Triage Notes (Signed)
States his doctor passed away and needs a refill on his ventolin inhaler.

## 2021-05-14 DIAGNOSIS — W458XXA Other foreign body or object entering through skin, initial encounter: Secondary | ICD-10-CM | POA: Diagnosis not present

## 2021-05-14 DIAGNOSIS — S61042A Puncture wound with foreign body of left thumb without damage to nail, initial encounter: Secondary | ICD-10-CM | POA: Diagnosis not present

## 2021-06-10 DIAGNOSIS — E291 Testicular hypofunction: Secondary | ICD-10-CM | POA: Diagnosis not present

## 2021-06-10 DIAGNOSIS — R030 Elevated blood-pressure reading, without diagnosis of hypertension: Secondary | ICD-10-CM | POA: Diagnosis not present

## 2021-06-10 DIAGNOSIS — K219 Gastro-esophageal reflux disease without esophagitis: Secondary | ICD-10-CM | POA: Diagnosis not present

## 2021-06-10 DIAGNOSIS — J4530 Mild persistent asthma, uncomplicated: Secondary | ICD-10-CM | POA: Diagnosis not present

## 2021-06-12 DIAGNOSIS — Z125 Encounter for screening for malignant neoplasm of prostate: Secondary | ICD-10-CM | POA: Diagnosis not present

## 2021-06-12 DIAGNOSIS — I10 Essential (primary) hypertension: Secondary | ICD-10-CM | POA: Diagnosis not present

## 2021-06-12 DIAGNOSIS — Z139 Encounter for screening, unspecified: Secondary | ICD-10-CM | POA: Diagnosis not present

## 2021-06-17 DIAGNOSIS — I10 Essential (primary) hypertension: Secondary | ICD-10-CM | POA: Diagnosis not present

## 2021-06-17 DIAGNOSIS — E291 Testicular hypofunction: Secondary | ICD-10-CM | POA: Diagnosis not present

## 2021-06-17 DIAGNOSIS — J4530 Mild persistent asthma, uncomplicated: Secondary | ICD-10-CM | POA: Diagnosis not present

## 2021-06-17 DIAGNOSIS — K219 Gastro-esophageal reflux disease without esophagitis: Secondary | ICD-10-CM | POA: Diagnosis not present

## 2021-07-01 DIAGNOSIS — I10 Essential (primary) hypertension: Secondary | ICD-10-CM | POA: Diagnosis not present

## 2021-07-14 ENCOUNTER — Other Ambulatory Visit (INDEPENDENT_AMBULATORY_CARE_PROVIDER_SITE_OTHER): Payer: Self-pay | Admitting: Gastroenterology

## 2021-07-14 DIAGNOSIS — K219 Gastro-esophageal reflux disease without esophagitis: Secondary | ICD-10-CM

## 2021-12-10 DIAGNOSIS — I10 Essential (primary) hypertension: Secondary | ICD-10-CM | POA: Diagnosis not present

## 2021-12-10 DIAGNOSIS — R7303 Prediabetes: Secondary | ICD-10-CM | POA: Diagnosis not present

## 2021-12-22 DIAGNOSIS — I10 Essential (primary) hypertension: Secondary | ICD-10-CM | POA: Diagnosis not present

## 2021-12-22 DIAGNOSIS — E291 Testicular hypofunction: Secondary | ICD-10-CM | POA: Diagnosis not present

## 2021-12-22 DIAGNOSIS — J4530 Mild persistent asthma, uncomplicated: Secondary | ICD-10-CM | POA: Diagnosis not present

## 2021-12-22 DIAGNOSIS — K219 Gastro-esophageal reflux disease without esophagitis: Secondary | ICD-10-CM | POA: Diagnosis not present

## 2022-06-16 DIAGNOSIS — R7303 Prediabetes: Secondary | ICD-10-CM | POA: Diagnosis not present

## 2022-06-16 DIAGNOSIS — I10 Essential (primary) hypertension: Secondary | ICD-10-CM | POA: Diagnosis not present

## 2022-06-23 DIAGNOSIS — E785 Hyperlipidemia, unspecified: Secondary | ICD-10-CM | POA: Diagnosis not present

## 2022-06-23 DIAGNOSIS — K219 Gastro-esophageal reflux disease without esophagitis: Secondary | ICD-10-CM | POA: Diagnosis not present

## 2022-06-23 DIAGNOSIS — E291 Testicular hypofunction: Secondary | ICD-10-CM | POA: Diagnosis not present

## 2022-06-23 DIAGNOSIS — J4530 Mild persistent asthma, uncomplicated: Secondary | ICD-10-CM | POA: Diagnosis not present

## 2022-06-23 DIAGNOSIS — I10 Essential (primary) hypertension: Secondary | ICD-10-CM | POA: Diagnosis not present

## 2022-12-16 DIAGNOSIS — R7303 Prediabetes: Secondary | ICD-10-CM | POA: Diagnosis not present

## 2022-12-16 DIAGNOSIS — I10 Essential (primary) hypertension: Secondary | ICD-10-CM | POA: Diagnosis not present

## 2022-12-23 ENCOUNTER — Other Ambulatory Visit (HOSPITAL_COMMUNITY): Payer: Self-pay | Admitting: Family Medicine

## 2022-12-23 DIAGNOSIS — J4530 Mild persistent asthma, uncomplicated: Secondary | ICD-10-CM | POA: Diagnosis not present

## 2022-12-23 DIAGNOSIS — E785 Hyperlipidemia, unspecified: Secondary | ICD-10-CM

## 2022-12-23 DIAGNOSIS — K219 Gastro-esophageal reflux disease without esophagitis: Secondary | ICD-10-CM | POA: Diagnosis not present

## 2022-12-23 DIAGNOSIS — I10 Essential (primary) hypertension: Secondary | ICD-10-CM | POA: Diagnosis not present

## 2022-12-23 DIAGNOSIS — E291 Testicular hypofunction: Secondary | ICD-10-CM | POA: Diagnosis not present

## 2023-02-10 ENCOUNTER — Ambulatory Visit (HOSPITAL_COMMUNITY)
Admission: RE | Admit: 2023-02-10 | Discharge: 2023-02-10 | Disposition: A | Discharge status: Home / Self Care | Payer: BC Managed Care – PPO | Source: Ambulatory Visit | Attending: Family Medicine | Admitting: Family Medicine

## 2023-02-10 DIAGNOSIS — E785 Hyperlipidemia, unspecified: Secondary | ICD-10-CM | POA: Insufficient documentation

## 2023-04-28 DIAGNOSIS — M79602 Pain in left arm: Secondary | ICD-10-CM | POA: Diagnosis not present

## 2023-04-28 DIAGNOSIS — I10 Essential (primary) hypertension: Secondary | ICD-10-CM | POA: Diagnosis not present

## 2023-04-28 DIAGNOSIS — K219 Gastro-esophageal reflux disease without esophagitis: Secondary | ICD-10-CM | POA: Diagnosis not present

## 2023-04-28 DIAGNOSIS — R079 Chest pain, unspecified: Secondary | ICD-10-CM | POA: Diagnosis not present

## 2023-05-12 DIAGNOSIS — E669 Obesity, unspecified: Secondary | ICD-10-CM | POA: Diagnosis not present

## 2023-05-12 DIAGNOSIS — Z87891 Personal history of nicotine dependence: Secondary | ICD-10-CM | POA: Diagnosis not present

## 2023-05-12 DIAGNOSIS — I10 Essential (primary) hypertension: Secondary | ICD-10-CM | POA: Diagnosis not present

## 2023-05-12 DIAGNOSIS — Z6834 Body mass index (BMI) 34.0-34.9, adult: Secondary | ICD-10-CM | POA: Diagnosis not present

## 2023-05-26 DIAGNOSIS — J45909 Unspecified asthma, uncomplicated: Secondary | ICD-10-CM | POA: Diagnosis not present

## 2023-05-26 DIAGNOSIS — Z87891 Personal history of nicotine dependence: Secondary | ICD-10-CM | POA: Diagnosis not present

## 2023-05-26 DIAGNOSIS — Z7951 Long term (current) use of inhaled steroids: Secondary | ICD-10-CM | POA: Diagnosis not present

## 2023-05-26 DIAGNOSIS — I10 Essential (primary) hypertension: Secondary | ICD-10-CM | POA: Diagnosis not present

## 2023-06-17 DIAGNOSIS — I10 Essential (primary) hypertension: Secondary | ICD-10-CM | POA: Diagnosis not present

## 2023-06-17 DIAGNOSIS — R7303 Prediabetes: Secondary | ICD-10-CM | POA: Diagnosis not present

## 2023-06-17 DIAGNOSIS — Z6834 Body mass index (BMI) 34.0-34.9, adult: Secondary | ICD-10-CM | POA: Diagnosis not present

## 2023-06-24 DIAGNOSIS — J45909 Unspecified asthma, uncomplicated: Secondary | ICD-10-CM | POA: Diagnosis not present

## 2023-06-24 DIAGNOSIS — J4530 Mild persistent asthma, uncomplicated: Secondary | ICD-10-CM | POA: Diagnosis not present

## 2023-06-24 DIAGNOSIS — K219 Gastro-esophageal reflux disease without esophagitis: Secondary | ICD-10-CM | POA: Diagnosis not present

## 2023-06-24 DIAGNOSIS — E291 Testicular hypofunction: Secondary | ICD-10-CM | POA: Diagnosis not present

## 2023-06-24 DIAGNOSIS — I10 Essential (primary) hypertension: Secondary | ICD-10-CM | POA: Diagnosis not present

## 2023-08-11 DIAGNOSIS — I10 Essential (primary) hypertension: Secondary | ICD-10-CM | POA: Diagnosis not present

## 2023-08-11 DIAGNOSIS — M79632 Pain in left forearm: Secondary | ICD-10-CM | POA: Diagnosis not present

## 2023-08-11 DIAGNOSIS — I169 Hypertensive crisis, unspecified: Secondary | ICD-10-CM | POA: Diagnosis not present

## 2023-08-16 DIAGNOSIS — L02421 Furuncle of right axilla: Secondary | ICD-10-CM | POA: Diagnosis not present

## 2023-08-16 DIAGNOSIS — I10 Essential (primary) hypertension: Secondary | ICD-10-CM | POA: Diagnosis not present

## 2023-08-16 DIAGNOSIS — L0292 Furuncle, unspecified: Secondary | ICD-10-CM | POA: Diagnosis not present

## 2023-12-23 DIAGNOSIS — R7303 Prediabetes: Secondary | ICD-10-CM | POA: Diagnosis not present

## 2023-12-29 DIAGNOSIS — E291 Testicular hypofunction: Secondary | ICD-10-CM | POA: Diagnosis not present

## 2023-12-29 DIAGNOSIS — K219 Gastro-esophageal reflux disease without esophagitis: Secondary | ICD-10-CM | POA: Diagnosis not present

## 2023-12-29 DIAGNOSIS — I10 Essential (primary) hypertension: Secondary | ICD-10-CM | POA: Diagnosis not present

## 2023-12-29 DIAGNOSIS — L0292 Furuncle, unspecified: Secondary | ICD-10-CM | POA: Diagnosis not present
